# Patient Record
Sex: Female | Born: 1960 | Race: White | Hispanic: No | Marital: Married | Smoking: Former smoker
Health system: Southern US, Community
[De-identification: ages and names within clinical notes are randomized; demographics above are authoritative.]

## PROBLEM LIST (undated history)

## (undated) DIAGNOSIS — E7800 Pure hypercholesterolemia, unspecified: Secondary | ICD-10-CM

## (undated) DIAGNOSIS — I241 Dressler's syndrome: Secondary | ICD-10-CM

## (undated) DIAGNOSIS — R011 Cardiac murmur, unspecified: Secondary | ICD-10-CM

## (undated) DIAGNOSIS — I2510 Atherosclerotic heart disease of native coronary artery without angina pectoris: Secondary | ICD-10-CM

## (undated) DIAGNOSIS — Z720 Tobacco use: Secondary | ICD-10-CM

## (undated) DIAGNOSIS — I10 Essential (primary) hypertension: Secondary | ICD-10-CM

## (undated) DIAGNOSIS — J90 Pleural effusion, not elsewhere classified: Secondary | ICD-10-CM

## (undated) DIAGNOSIS — I513 Intracardiac thrombosis, not elsewhere classified: Secondary | ICD-10-CM

## (undated) DIAGNOSIS — R0602 Shortness of breath: Secondary | ICD-10-CM

## (undated) DIAGNOSIS — K429 Umbilical hernia without obstruction or gangrene: Secondary | ICD-10-CM

## (undated) DIAGNOSIS — I219 Acute myocardial infarction, unspecified: Secondary | ICD-10-CM

## (undated) DIAGNOSIS — I319 Disease of pericardium, unspecified: Secondary | ICD-10-CM

## (undated) DIAGNOSIS — F419 Anxiety disorder, unspecified: Secondary | ICD-10-CM

## (undated) DIAGNOSIS — G4730 Sleep apnea, unspecified: Secondary | ICD-10-CM

## (undated) DIAGNOSIS — F32A Depression, unspecified: Secondary | ICD-10-CM

## (undated) DIAGNOSIS — M797 Fibromyalgia: Secondary | ICD-10-CM

## (undated) DIAGNOSIS — M4800 Spinal stenosis, site unspecified: Secondary | ICD-10-CM

## (undated) DIAGNOSIS — K219 Gastro-esophageal reflux disease without esophagitis: Secondary | ICD-10-CM

## (undated) DIAGNOSIS — I209 Angina pectoris, unspecified: Secondary | ICD-10-CM

## (undated) DIAGNOSIS — I509 Heart failure, unspecified: Secondary | ICD-10-CM

## (undated) DIAGNOSIS — I739 Peripheral vascular disease, unspecified: Secondary | ICD-10-CM

## (undated) DIAGNOSIS — F329 Major depressive disorder, single episode, unspecified: Secondary | ICD-10-CM

## (undated) DIAGNOSIS — G2581 Restless legs syndrome: Secondary | ICD-10-CM

## (undated) DIAGNOSIS — R51 Headache: Secondary | ICD-10-CM

## (undated) HISTORY — PX: CORONARY ANGIOPLASTY WITH STENT PLACEMENT: SHX49

## (undated) HISTORY — PX: CARPAL TUNNEL RELEASE: SHX101

## (undated) HISTORY — PX: CORONARY ANGIOPLASTY: SHX604

---

## 1992-03-03 HISTORY — PX: TUBAL LIGATION: SHX77

## 2011-03-05 ENCOUNTER — Inpatient Hospital Stay (HOSPITAL_COMMUNITY): Payer: Self-pay

## 2011-03-05 ENCOUNTER — Inpatient Hospital Stay (HOSPITAL_COMMUNITY)
Admission: EM | Admit: 2011-03-05 | Discharge: 2011-03-15 | DRG: 247 | Disposition: A | Discharge status: Home / Self Care | Payer: MEDICAID | Source: Other Acute Inpatient Hospital | Attending: Cardiology | Admitting: Cardiology

## 2011-03-05 ENCOUNTER — Other Ambulatory Visit: Payer: Self-pay

## 2011-03-05 ENCOUNTER — Encounter (HOSPITAL_COMMUNITY): Payer: Self-pay | Admitting: Physician Assistant

## 2011-03-05 ENCOUNTER — Encounter (HOSPITAL_COMMUNITY)
Admission: EM | Disposition: A | Discharge status: Home / Self Care | Payer: Self-pay | Source: Other Acute Inpatient Hospital | Attending: Cardiology

## 2011-03-05 DIAGNOSIS — I219 Acute myocardial infarction, unspecified: Secondary | ICD-10-CM

## 2011-03-05 DIAGNOSIS — F17200 Nicotine dependence, unspecified, uncomplicated: Secondary | ICD-10-CM | POA: Diagnosis present

## 2011-03-05 DIAGNOSIS — I2119 ST elevation (STEMI) myocardial infarction involving other coronary artery of inferior wall: Principal | ICD-10-CM | POA: Diagnosis present

## 2011-03-05 DIAGNOSIS — R079 Chest pain, unspecified: Secondary | ICD-10-CM | POA: Diagnosis present

## 2011-03-05 DIAGNOSIS — I2510 Atherosclerotic heart disease of native coronary artery without angina pectoris: Secondary | ICD-10-CM

## 2011-03-05 DIAGNOSIS — I2109 ST elevation (STEMI) myocardial infarction involving other coronary artery of anterior wall: Secondary | ICD-10-CM | POA: Diagnosis present

## 2011-03-05 DIAGNOSIS — D649 Anemia, unspecified: Secondary | ICD-10-CM | POA: Diagnosis not present

## 2011-03-05 DIAGNOSIS — IMO0001 Reserved for inherently not codable concepts without codable children: Secondary | ICD-10-CM | POA: Diagnosis present

## 2011-03-05 DIAGNOSIS — E7800 Pure hypercholesterolemia, unspecified: Secondary | ICD-10-CM | POA: Diagnosis present

## 2011-03-05 DIAGNOSIS — F411 Generalized anxiety disorder: Secondary | ICD-10-CM | POA: Diagnosis present

## 2011-03-05 DIAGNOSIS — M4800 Spinal stenosis, site unspecified: Secondary | ICD-10-CM | POA: Diagnosis not present

## 2011-03-05 DIAGNOSIS — G2581 Restless legs syndrome: Secondary | ICD-10-CM | POA: Diagnosis present

## 2011-03-05 DIAGNOSIS — I241 Dressler's syndrome: Secondary | ICD-10-CM | POA: Diagnosis not present

## 2011-03-05 DIAGNOSIS — I959 Hypotension, unspecified: Secondary | ICD-10-CM | POA: Diagnosis not present

## 2011-03-05 DIAGNOSIS — M797 Fibromyalgia: Secondary | ICD-10-CM | POA: Diagnosis present

## 2011-03-05 DIAGNOSIS — I309 Acute pericarditis, unspecified: Secondary | ICD-10-CM

## 2011-03-05 DIAGNOSIS — Z720 Tobacco use: Secondary | ICD-10-CM

## 2011-03-05 HISTORY — PX: PERCUTANEOUS CORONARY STENT INTERVENTION (PCI-S): SHX5485

## 2011-03-05 HISTORY — DX: Anxiety disorder, unspecified: F41.9

## 2011-03-05 HISTORY — DX: Tobacco use: Z72.0

## 2011-03-05 HISTORY — PX: LEFT HEART CATHETERIZATION WITH CORONARY ANGIOGRAM: SHX5451

## 2011-03-05 HISTORY — DX: Acute myocardial infarction, unspecified: I21.9

## 2011-03-05 HISTORY — DX: Spinal stenosis, site unspecified: M48.00

## 2011-03-05 HISTORY — DX: Disease of pericardium, unspecified: I31.9

## 2011-03-05 HISTORY — DX: Restless legs syndrome: G25.81

## 2011-03-05 HISTORY — DX: Fibromyalgia: M79.7

## 2011-03-05 LAB — CBC
HCT: 42.9 % (ref 36.0–46.0)
Hemoglobin: 14.5 g/dL (ref 12.0–15.0)
MCH: 28.5 pg (ref 26.0–34.0)
MCHC: 33.8 g/dL (ref 30.0–36.0)
MCV: 84.4 fL (ref 78.0–100.0)
RBC: 5.08 MIL/uL (ref 3.87–5.11)

## 2011-03-05 LAB — COMPREHENSIVE METABOLIC PANEL
ALT: 23 U/L (ref 0–35)
BUN: 9 mg/dL (ref 6–23)
CO2: 21 mEq/L (ref 19–32)
Calcium: 8.7 mg/dL (ref 8.4–10.5)
Creatinine, Ser: 0.6 mg/dL (ref 0.50–1.10)
GFR calc Af Amer: >90 mL/min (ref >90)
GFR calc non Af Amer: >90 mL/min (ref >90)
Glucose, Bld: 119 mg/dL — ABNORMAL HIGH (ref 70–99)
Sodium: 135 mEq/L (ref 135–145)
Total Protein: 6.6 g/dL (ref 6.0–8.3)

## 2011-03-05 LAB — LIPID PANEL
Cholesterol: 237 mg/dL — ABNORMAL HIGH (ref 0–200)
VLDL: 26 mg/dL (ref 0–40)

## 2011-03-05 LAB — MRSA PCR SCREENING: MRSA by PCR: NEGATIVE

## 2011-03-05 LAB — CARDIAC PANEL(CRET KIN+CKTOT+MB+TROPI)
Total CK: 692 U/L — ABNORMAL HIGH (ref 7–177)
Total CK: 848 U/L — ABNORMAL HIGH (ref 7–177)
Troponin I: 8.89 ng/mL (ref <0.30)

## 2011-03-05 LAB — POCT I-STAT, CHEM 8
Calcium, Ion: 1.13 mmol/L (ref 1.12–1.32)
Chloride: 108 mEq/L (ref 96–112)
HCT: 46 % (ref 36.0–46.0)

## 2011-03-05 LAB — POCT ACTIVATED CLOTTING TIME: Activated Clotting Time: 435 seconds

## 2011-03-05 LAB — PROTIME-INR: INR: 7.11 (ref 0.00–1.49)

## 2011-03-05 LAB — HEMOGLOBIN A1C: Hgb A1c MFr Bld: 5.3 % (ref <5.7)

## 2011-03-05 LAB — APTT: aPTT: >200 seconds (ref 24–37)

## 2011-03-05 SURGERY — LEFT HEART CATHETERIZATION WITH CORONARY ANGIOGRAM
Anesthesia: LOCAL

## 2011-03-05 MED ORDER — HEPARIN (PORCINE) IN NACL 2-0.9 UNIT/ML-% IJ SOLN
INTRAMUSCULAR | Status: AC
  Filled 2011-03-05: qty 2000

## 2011-03-05 MED ORDER — MIDAZOLAM HCL 2 MG/2ML IJ SOLN
INTRAMUSCULAR | Status: AC
  Filled 2011-03-05: qty 2

## 2011-03-05 MED ORDER — NITROGLYCERIN 0.2 MG/ML ON CALL CATH LAB
INTRAVENOUS | Status: AC
  Filled 2011-03-05: qty 1

## 2011-03-05 MED ORDER — LIDOCAINE HCL (PF) 1 % IJ SOLN
INTRAMUSCULAR | Status: AC
  Filled 2011-03-05: qty 30

## 2011-03-05 MED ORDER — VITAMIN D (ERGOCALCIFEROL) 1.25 MG (50000 UNIT) PO CAPS
50000.0000 [IU] | ORAL_CAPSULE | ORAL | Status: DC
  Administered 2011-03-05: 50000 [IU] via ORAL
  Filled 2011-03-05: qty 1

## 2011-03-05 MED ORDER — HYDROCODONE-ACETAMINOPHEN 10-325 MG PO TABS
1.0000 | ORAL_TABLET | Freq: Four times a day (QID) | ORAL | Status: AC | PRN
  Administered 2011-03-05 – 2011-03-09 (×6): 1 via ORAL
  Filled 2011-03-05 (×7): qty 1

## 2011-03-05 MED ORDER — ZOLPIDEM TARTRATE 5 MG PO TABS
10.0000 mg | ORAL_TABLET | Freq: Every evening | ORAL | Status: DC | PRN
  Administered 2011-03-05 – 2011-03-14 (×8): 10 mg via ORAL
  Filled 2011-03-05 (×2): qty 1
  Filled 2011-03-05 (×8): qty 2

## 2011-03-05 MED ORDER — METOPROLOL TARTRATE 25 MG PO TABS
25.0000 mg | ORAL_TABLET | Freq: Two times a day (BID) | ORAL | Status: DC
  Administered 2011-03-05 – 2011-03-06 (×2): 25 mg via ORAL
  Filled 2011-03-05 (×3): qty 1

## 2011-03-05 MED ORDER — ACETAMINOPHEN 325 MG PO TABS: 650.0000 mg | ORAL_TABLET | ORAL | Status: DC | PRN

## 2011-03-05 MED ORDER — TIZANIDINE HCL 4 MG PO TABS
6.0000 mg | ORAL_TABLET | Freq: Three times a day (TID) | ORAL | Status: DC
  Administered 2011-03-05 – 2011-03-08 (×8): 6 mg via ORAL
  Filled 2011-03-05 (×10): qty 1

## 2011-03-05 MED ORDER — ADULT MULTIVITAMIN W/MINERALS CH
1.0000 | ORAL_TABLET | Freq: Every day | ORAL | Status: DC
  Administered 2011-03-05 – 2011-03-15 (×11): 1 via ORAL
  Filled 2011-03-05 (×11): qty 1

## 2011-03-05 MED ORDER — SODIUM CHLORIDE 0.9 % IV SOLN: INTRAVENOUS | Status: AC

## 2011-03-05 MED ORDER — POTASSIUM CHLORIDE CRYS ER 20 MEQ PO TBCR
30.0000 meq | EXTENDED_RELEASE_TABLET | Freq: Once | ORAL | Status: AC
  Administered 2011-03-05: 30 meq via ORAL
  Filled 2011-03-05 (×2): qty 1

## 2011-03-05 MED ORDER — BIVALIRUDIN 250 MG IV SOLR
INTRAVENOUS | Status: AC
  Filled 2011-03-05: qty 250

## 2011-03-05 MED ORDER — LISINOPRIL 5 MG PO TABS
5.0000 mg | ORAL_TABLET | Freq: Every day | ORAL | Status: DC
  Administered 2011-03-06: 5 mg via ORAL
  Filled 2011-03-05: qty 1

## 2011-03-05 MED ORDER — ONDANSETRON HCL 4 MG/2ML IJ SOLN: 4.0000 mg | Freq: Four times a day (QID) | INTRAMUSCULAR | Status: DC | PRN

## 2011-03-05 MED ORDER — METOPROLOL TARTRATE 1 MG/ML IV SOLN
INTRAVENOUS | Status: AC
  Filled 2011-03-05: qty 5

## 2011-03-05 MED ORDER — ROSUVASTATIN CALCIUM 40 MG PO TABS
40.0000 mg | ORAL_TABLET | Freq: Every day | ORAL | Status: DC
  Administered 2011-03-06 – 2011-03-14 (×9): 40 mg via ORAL
  Filled 2011-03-05 (×10): qty 1

## 2011-03-05 MED ORDER — GABAPENTIN 300 MG PO CAPS
600.0000 mg | ORAL_CAPSULE | Freq: Three times a day (TID) | ORAL | Status: DC
  Administered 2011-03-05 – 2011-03-15 (×29): 600 mg via ORAL
  Filled 2011-03-05 (×31): qty 2

## 2011-03-05 MED ORDER — ONE-DAILY MULTI VITAMINS PO TABS: 1.0000 | ORAL_TABLET | Freq: Every day | ORAL | Status: DC

## 2011-03-05 MED ORDER — FENTANYL CITRATE 0.05 MG/ML IJ SOLN
INTRAMUSCULAR | Status: AC
  Filled 2011-03-05: qty 2

## 2011-03-05 MED ORDER — ROPINIROLE HCL 1 MG PO TABS
2.0000 mg | ORAL_TABLET | Freq: Every day | ORAL | Status: DC
  Administered 2011-03-05 – 2011-03-14 (×10): 2 mg via ORAL
  Filled 2011-03-05 (×11): qty 2

## 2011-03-05 MED ORDER — ASPIRIN 81 MG PO CHEW
81.0000 mg | CHEWABLE_TABLET | Freq: Every day | ORAL | Status: DC
  Administered 2011-03-05 – 2011-03-06 (×2): 81 mg via ORAL
  Filled 2011-03-05 (×2): qty 1

## 2011-03-05 MED ORDER — CLOPIDOGREL BISULFATE 75 MG PO TABS
75.0000 mg | ORAL_TABLET | Freq: Every day | ORAL | Status: DC
  Administered 2011-03-06 – 2011-03-15 (×10): 75 mg via ORAL
  Filled 2011-03-05 (×13): qty 1

## 2011-03-05 MED ORDER — VERAPAMIL HCL 2.5 MG/ML IV SOLN
INTRAVENOUS | Status: AC
  Filled 2011-03-05: qty 2

## 2011-03-05 NOTE — Progress Notes (Signed)
Chaplain Note:  Chaplain responded to Code STEMI page.  Chaplain provided spiritual support for pt and staff.  Pt was being treated by Cath Lab staff.  Chaplain did not communicated directly.  None of pt's friends or family were present during this time.   03/05/11 1520  Clinical Encounter Type  Visited With Health care provider;Patient  Visit Type Spiritual support  Referral From Other (Comment) (Code STEMI page)  Stress Factors  Patient Stress Factors Health changes    Verdie Shire, chaplain resident [phone]

## 2011-03-05 NOTE — Interval H&P Note (Signed)
History and Physical Interval Note:  03/05/2011 5:19 PM  Kristy Sloan  has presented today for surgery, with the diagnosis of stemi  The various methods of treatment have been discussed with the patient and family. After consideration of risks, benefits and other options for treatment, the patient has consented to  Procedure(s): LEFT HEART CATHETERIZATION WITH CORONARY ANGIOGRAM PERCUTANEOUS CORONARY STENT INTERVENTION (PCI-S) as a surgical intervention .  The patients' history has been reviewed, patient examined, and ECG reviewed.  Emergency cath and poss PCI with reperfusion therapy recommended.  Patient is agreeable.   Shawnie Pons

## 2011-03-05 NOTE — Progress Notes (Signed)
Left femoral cath sheath pulled at 1830 and pressure held until 0700. Site at level 0. VS remain stable. Pt. Tolerated procedure well.  Applied gauze dressing to site.

## 2011-03-05 NOTE — Op Note (Signed)
Cardiac Catheterization Procedure Note  Name: Kriss Perleberg MRN: [id number] DOB: [birth year]  Procedure: Left Heart Cath, Selective Coronary Angiography, LV angiography,  PTCA/Stent of LAD  Indication: Ms. Bochicchio is a 51 year old who's had chest pain since yesterday. She thought she was having an anxiety attack. She presented today to [facility] emergency room, and the EKG was consistent with a myocardial infarction. Findings include inferior and anterolateral leads. She was transferred emergently to [facility] for cardiac catheterization possible PCI.  Diagnostic Procedure Details: Following emergency consent, the left groin was prepped, draped, and anesthetized with 1% lidocaine. Using a smart needle, a 6 French sheath was introduced into the left femoral artery. Standard Judkins catheters were used for selective coronary angiography and left ventriculography. Catheter exchanges were performed over a wire.  The diagnostic procedure was well-tolerated without immediate complications.  Findings demonstrated a ruptured plaque in the proximal left anterior descending artery, and distal embolization at the apex. The right coronary artery and circumflex coronary artery were patent. Preparations were then made for emergency PCI. Bivalirudin was given using a weight-based protocol. The patient received 600 mg of Plavix at the outside facility. She also received chewable aspirin and unfractionated heparin. ACT was checked and was appropriate. A JL 3.5 guiding catheter was utilized. We placed a pro-water wire in the intermediate branch as an anchor, and placed a traverse wire down the left anterior descending artery. The proximal left anterior descending artery was then dilated using a 2.25 mm balloon. We then took the balloon down to the apex to see if this would help with reperfusion at the site of the apical occlusion. The vessel did not open with this, and a 1.5 x 20 mm balloon was placed down at the apical  vessel and several dilatations performed. The vessel in this location appeared to be too small for safe aspiration. Our impression was that the infarct was probably a late apical infarct, as a result of embolization from a high-grade proximal LAD ruptured plaque.  We proceeded to stent the proximal left anterior descending artery with a 2.75 x 16 mm Promus element drug-eluting stent. There was an excellent angiographic result. There did not appear to be significant side branch compromise. Postdilatation was done with a 3 mm noncompliant balloon. There was also evidence of ostial left anterior descending disease, best seen in the LAO caudal views, but it did not appear to be critical and we felt that conservative approach to this was the best option.  Her chest pain was improved. We also, during the case, injected verapamil 200 mcg down the coronary artery in the instance that the distal occlusion represented spasm. This also led to no improvement in the apical vessel. The procedure was subsequently completed, and the femoral sheath secured in place. She was taken to the coronary care unit for convalescent care, and I spoke with her husband by phone.  There were no major complications of therapy.  PROCEDURAL FINDINGS Hemodynamics: AO 141/72 (100) LV 139/20 No gradient on pull back.    Coronary angiography: Coronary dominance: right  Left mainstem: The LMCA was free of significant disease.  Left anterior descending (LAD):  The left anterior descending artery coursed to the cardiac apex. The ostium had about 40% eccentric narrowing. Between the ostium and the major diagonal branch, there was evidence of a ruptured plaque, with hazy appearance and 80% eccentric stenosis.  This was stented to 0% across the smaller diagonal branch without compromise. The distal LAD was totally occluded due to what  appeared to be most likely distal embolization, not from the procedure, but spontaneous  (probably yesterday and  the likely source of continuing pain and ECG change).  Despite dilatation with small balloons, no reperfusion was established in the apical portion of the vessel.  We thought the vessel was likely to small for aspiration, and could also represent apical edema preventing flow.  The proximal site, source of likely embolization, appeared excellent post PCI.   Left circumflex (LCx): The ramus and AV circ supplying the OM was of moderate size, and without critical narrowing.    Right coronary artery (RCA): The proximal RCA had mild irregularity.  The distal vessel supplied a smaller caliber PDA and PLA, without significant narrowing.  Left ventriculography: Left ventricular systolic function is reduced, LVEF is estimated at 45%, there is no significant mitral regurgitation.  There is an apical WMA.     PCI Data: Vessel - LAD/Segment - 12 Percent Stenosis (pre)  80% ruptured eccentric plaque TIMI-flow 3 Stent 20 by 2.75 Promus Element Percent Stenosis (post) 0 TIMI-flow (post) 3  Final Conclusions:   1.  Acute anterior MI with ruptured proximal LAD lesion, with distal embolization and occlusion of the apical LAD. 2.  Successful PCI of the proximal ruptured plaque with DES   (Promus Element) 3.  Persistent occlusion of the apical LAD despite balloon and "dottering"  (small caliber vessel) 4.  No critical disease in CFX or RCA 5. 40-50% ostial LAD disease, but widely patent  Recommendations:  1. DAPT for minimum one year 2. Beta blockers, ACE, and statin 3.  Tobacco cessation treatment 4. 2D echo to exclude apical mural thrombus. 5.  Immediate monitoring in CCU.  Shawnie Pons 03/05/2011, 5:34 PM

## 2011-03-05 NOTE — Progress Notes (Signed)
PM  Stable.  No chest pain. Sheath out and stable. Enzymes pos, and ECG shows persistent STE consistent with her late presentation, and apical vessel with no flow, likely due to myocardial edema.  I explained this to the patient and family.  Stent site looks excellent.  Risks of myocardial rupture and LV mural thrombus are higher, but she will be monitored in the hospital.    Shawnie Pons 8:46 PM 03/05/2011

## 2011-03-05 NOTE — H&P (Signed)
History and Physical  Patient ID: Kristy Sloan MRN: [id number], SOB: 12/08/1960 51 y.o. Date of Encounter: 03/05/2011, 3:35 PM  Primary Physician: No primary provider on file. Primary Cardiologist: New  Chief Complaint: chest pain Reason for Admission: STEMI  HPI: 51 y/o F with no prior cardiac hx presented to [facility] this afternoon with complaints of progressive left-sided chest pain since 6 pm last night which has gotten worse over the last several hours. It has been associated with shortness of breath and nausea. No diaphoresis or similar symptoms before. EKG at [facility] demonstrated ST elevation inferiorly & laterally. She was given 324mg  of ASA, 600mg  of Plavix, and started on heparin then transferred to [facility] for emergent catheterization. At this time she is having ongoing pain. Of note she is also on her period which is the first she's had in 9 months.  Past Medical History  Diagnosis Date  . Anxiety   . Spinal stenosis   . Fibromyalgia   . Restless legs syndrome      Surgical History:  Past Surgical History  Procedure Date  . Hand surgery   . Cesarean section      Home Meds: Medication Sig  ferrous fumarate-iron polysaccharide complex (TANDEM) 162-115.2 MG CAPS Take 1 capsule by mouth daily.    gabapentin (NEURONTIN) 300 MG capsule Take 600 mg by mouth 3 (three) times daily.    HYDROcodone-acetaminophen (LORTAB) 10-500 MG per tablet Take 1 tablet by mouth QID.    Multiple Vitamin (MULTIVITAMIN) tablet Take 1 tablet by mouth daily.    rOPINIRole (REQUIP) 0.5 MG tablet Take 2 mg by mouth at bedtime.    tiZANidine (ZANAFLEX) 4 MG tablet Take 6 mg by mouth 3 (three) times daily.    Vitamin D, Ergocalciferol, (DRISDOL) 50000 UNITS CAPS Take 50,000 Units by mouth every 7 (seven) days.    zolpidem (AMBIEN) 10 MG tablet Take 10 mg by mouth at bedtime as needed.      Allergies: None per pt  Social History   Social History Main Topics  . Smoking  status: Former Smoker    Types: Cigarettes  . Smokeless tobacco: Not on file   Comment: Quit 3 months ago  . Alcohol Use: No  . Drug Use: No  . Sexually Active: Not on file   Other Topics Concern  . Not on file   Social History Narrative   [place] Mar[place]. Doesn't currently work.     Family History  Problem Relation Age of Onset  . Coronary artery disease Father   . Coronary artery disease Sister     Posey Rea of age, but sister is younger    Review of Systems: General: negative for chills, fever, night sweats or weight changes.  Cardiovascular: see above. No edema, orthopnea, palpitations, paroxysmal nocturnal dyspnea,  Dermatological: negative for rash Respiratory: negative for cough or wheezing Urologic: negative for hematuria Abdominal: +nausea. No diarrhea, bright red blood per rectum, melena, or hematemesis Neurologic: negative for visual changes, syncope, or dizziness +on her period, last one 9 months ago All other systems reviewed and are otherwise negative except as noted above.  Labs: None yet  Radiology/Studies: No results found.   EKG: Sinus tach 107 bpm ST elevation II, III, avF and V3-V6  Physical Exam: P93 BP130/82 RR16 POX 99%2L  General: Well developed, well nourished, in no acute distress. Head: Normocephalic, atraumatic, sclera non-icteric, no xanthomas, nares are without discharge.  Neck: No lymphadenopathy. JVD not elevated.  Lungs: Clear bilaterally to auscultation without wheezes, rales, or rhonchi. Breathing is unlabored. Heart: Slightly tachycardic, regular with S1 S2. No murmurs, rubs, or gallops appreciated. Abdomen: Soft, non-tender, non-distended with normoactive bowel sounds. No hepatomegaly. No rebound/guarding. No obvious abdominal masses. Msk:  Strength and tone appear normal for age. Extremities: No clubbing or cyanosis. No edema.  Distal pedal pulses are 2+ and equal bilaterally. Neuro: Alert and oriented X 3.  Moves all extremities spontaneously. Psych:  Anxious appearing. Responds to questions appropriately with a normal affect.     ASSESSMENT AND PLAN:  1. Acute inferolateral STEMI - for emergent catheterization +/- PCI depending on results. Meds including antiplatelet agent per Dr. Riley Kill pending completion of case.  2. Anxiety - would likely continue home meds if suitable post-cath.  Signed, Ronie Spies PA-C 03/05/2011, 3:35 PM  Patient seen.  Cardiac exam without sig murmur at present. .  ECG consistent with STEMI, probably of 12 hours duration.  Urgent cath with possible PCI recommended as an emergency procedure.  She presented late in part thinking this was an anxiety attack, but has had persistent pain.  I briefly discussed risks with the patient, and she was agreeable to proceeding with emergency cardiac catheterization.    Shawnie Pons 03/05/2011 5:19 PM  5:18 PM

## 2011-03-06 ENCOUNTER — Other Ambulatory Visit: Payer: Self-pay

## 2011-03-06 ENCOUNTER — Encounter (HOSPITAL_COMMUNITY)
Admission: EM | Disposition: A | Discharge status: Home / Self Care | Payer: Self-pay | Source: Other Acute Inpatient Hospital | Attending: Cardiology

## 2011-03-06 ENCOUNTER — Encounter (HOSPITAL_COMMUNITY): Payer: Self-pay | Admitting: Dietician

## 2011-03-06 DIAGNOSIS — I059 Rheumatic mitral valve disease, unspecified: Secondary | ICD-10-CM

## 2011-03-06 DIAGNOSIS — R079 Chest pain, unspecified: Secondary | ICD-10-CM | POA: Diagnosis present

## 2011-03-06 DIAGNOSIS — I2510 Atherosclerotic heart disease of native coronary artery without angina pectoris: Secondary | ICD-10-CM

## 2011-03-06 DIAGNOSIS — I2109 ST elevation (STEMI) myocardial infarction involving other coronary artery of anterior wall: Secondary | ICD-10-CM | POA: Diagnosis present

## 2011-03-06 DIAGNOSIS — M797 Fibromyalgia: Secondary | ICD-10-CM | POA: Diagnosis present

## 2011-03-06 HISTORY — PX: LEFT HEART CATHETERIZATION WITH CORONARY ANGIOGRAM: SHX5451

## 2011-03-06 LAB — CBC
HCT: 43 % (ref 36.0–46.0)
MCHC: 32.6 g/dL (ref 30.0–36.0)
RDW: 15.3 % (ref 11.5–15.5)

## 2011-03-06 LAB — BASIC METABOLIC PANEL
BUN: 8 mg/dL (ref 6–23)
GFR calc Af Amer: >90 mL/min (ref >90)
GFR calc non Af Amer: >90 mL/min (ref >90)
Potassium: 3.9 mEq/L (ref 3.5–5.1)
Sodium: 135 mEq/L (ref 135–145)

## 2011-03-06 LAB — CARDIAC PANEL(CRET KIN+CKTOT+MB+TROPI)
CK, MB: 34.6 ng/mL (ref 0.3–4.0)
Relative Index: 16.5 — ABNORMAL HIGH (ref 0.0–2.5)
Relative Index: 16.1 — ABNORMAL HIGH (ref 0.0–2.5)
Total CK: 654 U/L — ABNORMAL HIGH (ref 7–177)
Troponin I: 13.36 ng/mL (ref <0.30)
Troponin I: 12.19 ng/mL (ref <0.30)
Troponin I: 7.86 ng/mL (ref <0.30)

## 2011-03-06 SURGERY — LEFT HEART CATHETERIZATION WITH CORONARY ANGIOGRAM
Anesthesia: LOCAL

## 2011-03-06 MED ORDER — MORPHINE SULFATE 2 MG/ML IJ SOLN
2.0000 mg | INTRAMUSCULAR | Status: DC | PRN
  Administered 2011-03-06 – 2011-03-07 (×4): 2 mg via INTRAVENOUS
  Filled 2011-03-06 (×3): qty 1

## 2011-03-06 MED ORDER — SODIUM CHLORIDE 0.9 % IJ SOLN: 3.0000 mL | Freq: Two times a day (BID) | INTRAMUSCULAR | Status: DC

## 2011-03-06 MED ORDER — MORPHINE SULFATE 4 MG/ML IJ SOLN
INTRAMUSCULAR | Status: AC
  Filled 2011-03-06: qty 1

## 2011-03-06 MED ORDER — INFLUENZA VIRUS VACC SPLIT PF IM SUSP: 0.5000 mL | INTRAMUSCULAR | Status: DC

## 2011-03-06 MED ORDER — METOPROLOL TARTRATE 25 MG PO TABS
25.0000 mg | ORAL_TABLET | Freq: Three times a day (TID) | ORAL | Status: DC
  Administered 2011-03-08 – 2011-03-09 (×5): 25 mg via ORAL
  Filled 2011-03-06 (×13): qty 1

## 2011-03-06 MED ORDER — SODIUM CHLORIDE 0.9 % IV SOLN
INTRAVENOUS | Status: DC
  Administered 2011-03-06: 100 mL/h via INTRAVENOUS

## 2011-03-06 MED ORDER — ONDANSETRON HCL 4 MG/2ML IJ SOLN
INTRAMUSCULAR | Status: AC
  Filled 2011-03-06: qty 2

## 2011-03-06 MED ORDER — NITROGLYCERIN 0.2 MG/ML ON CALL CATH LAB
INTRAVENOUS | Status: AC
  Filled 2011-03-06: qty 1

## 2011-03-06 MED ORDER — FENTANYL CITRATE 0.05 MG/ML IJ SOLN
INTRAMUSCULAR | Status: AC
  Filled 2011-03-06: qty 2

## 2011-03-06 MED ORDER — MORPHINE SULFATE 2 MG/ML IJ SOLN
INTRAMUSCULAR | Status: AC
  Administered 2011-03-06: 2 mg via INTRAVENOUS
  Filled 2011-03-06: qty 1

## 2011-03-06 MED ORDER — SODIUM CHLORIDE 0.9 % IJ SOLN: 3.0000 mL | INTRAMUSCULAR | Status: DC | PRN

## 2011-03-06 MED ORDER — SODIUM CHLORIDE 0.9 % IV SOLN: 1.0000 mL/kg/h | INTRAVENOUS | Status: DC

## 2011-03-06 MED ORDER — MORPHINE SULFATE 4 MG/ML IJ SOLN
2.0000 mg | Freq: Once | INTRAMUSCULAR | Status: AC
  Administered 2011-03-06: 2 mg via INTRAVENOUS

## 2011-03-06 MED ORDER — DIAZEPAM 5 MG PO TABS
ORAL_TABLET | ORAL | Status: AC
  Filled 2011-03-06: qty 1

## 2011-03-06 MED ORDER — LIDOCAINE HCL (PF) 1 % IJ SOLN
INTRAMUSCULAR | Status: AC
  Filled 2011-03-06: qty 30

## 2011-03-06 MED ORDER — SODIUM CHLORIDE 0.9 % IV BOLUS (SEPSIS): 500.0000 mL | Freq: Once | INTRAVENOUS | Status: DC

## 2011-03-06 MED ORDER — MORPHINE SULFATE 2 MG/ML IJ SOLN
INTRAMUSCULAR | Status: AC
  Administered 2011-03-06: 2 mg via INTRAVASCULAR
  Filled 2011-03-06: qty 1

## 2011-03-06 MED ORDER — MIDAZOLAM HCL 2 MG/2ML IJ SOLN
INTRAMUSCULAR | Status: AC
  Filled 2011-03-06: qty 2

## 2011-03-06 MED ORDER — SODIUM CHLORIDE 0.9 % IV BOLUS (SEPSIS)
250.0000 mL | Freq: Once | INTRAVENOUS | Status: AC
  Administered 2011-03-06: 250 mL via INTRAVENOUS

## 2011-03-06 MED ORDER — ASPIRIN 325 MG PO TABS
650.0000 mg | ORAL_TABLET | Freq: Once | ORAL | Status: AC
  Administered 2011-03-06: 650 mg via ORAL
  Filled 2011-03-06: qty 2

## 2011-03-06 MED ORDER — MILNACIPRAN HCL 50 MG PO TABS
100.0000 mg | ORAL_TABLET | Freq: Two times a day (BID) | ORAL | Status: DC
  Administered 2011-03-06 – 2011-03-12 (×14): 100 mg via ORAL
  Filled 2011-03-06 (×18): qty 2

## 2011-03-06 MED ORDER — SODIUM CHLORIDE 0.9 % IV SOLN: 250.0000 mL | INTRAVENOUS | Status: DC | PRN

## 2011-03-06 MED ORDER — SODIUM CHLORIDE 0.9 % IV SOLN: INTRAVENOUS | Status: AC

## 2011-03-06 MED ORDER — HEPARIN (PORCINE) IN NACL 2-0.9 UNIT/ML-% IJ SOLN
INTRAMUSCULAR | Status: AC
  Filled 2011-03-06: qty 2000

## 2011-03-06 MED ORDER — METOPROLOL TARTRATE 25 MG PO TABS: 25.0000 mg | ORAL_TABLET | Freq: Once | ORAL | Status: DC

## 2011-03-06 MED ORDER — DIAZEPAM 5 MG PO TABS
5.0000 mg | ORAL_TABLET | ORAL | Status: AC
  Administered 2011-03-06: 5 mg via ORAL

## 2011-03-06 MED FILL — Dextrose Inj 5%: INTRAVENOUS | Qty: 50 | Status: AC

## 2011-03-06 NOTE — Progress Notes (Signed)
Notified by RN regarding worsening ST elevations on EKG, previously noted from this morning at which time patient was seen by Dr. Riley Kill. She reports chest pain worse with inspiration and SOB. Pt in NAD. CV- tachycardic, clear S1, S2, no rubs, murmurs, no JVD. Lungs- CTAB no wheezes, rales or rhonchi. Ext- no LE edema, pulses 2+ throughout. EKG revealed NSR, borderline tachycardic at 99bpm increased ST elevations V2-V5, II, III, aVF. Dr. Riley Kill notified, saw patient, and elected for repeat cardiac catheterization to ensure vessel patency. Pre-cath orders placed. She was informed, consented and agreed to the procedure. She will be prepped for cath.   Jacqulyn Bath, PA-C 03/06/2011 3:47 PM  Patient seen and agree.  Will take back to the cath lab to look at PCI site.   Patient is agreeable to going back to the lab.    Shawnie Pons 9:06 PM 03/06/2011

## 2011-03-06 NOTE — Progress Notes (Signed)
Patient has pain only with inspiration.  Her EF is nearly normal by echo.  Her ECG is abnormal, but the proximal stent site is open, and fortunately, her P2Y12 assay reveals a clopidogrel responder.  Her HR is elevated.  Will give additional fluids, then increase metoprolol to every eight hours to slow her rate.  Ace has been placed on hold.    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  Will continue to observe.    Shawnie Pons 9:05 PM 03/06/2011

## 2011-03-06 NOTE — Progress Notes (Signed)
More ST elevation noted on monitor than previously. Notified Dr. Riley Kill. 12 Lead done and taken to him in cath lab. Order recv;d. To prep for cath.

## 2011-03-06 NOTE — Op Note (Signed)
Cardiac Catheterization Procedure Note  Name: Kristy Sloan MRN: [id number] DOB: Sep 19, 1960  Procedure: Placement of catheters for angio without left heart cath, coronary angiography.  Indication: [name] presented yesterday with delayed presentation anterior MI with distal occlusion likely secondary to distal embolization and late myocardial edema, and proximal ruptured plaque.  She had stenting of the proximal plaque rupture and an attempt to open the apical vessel without success.  She has had persistent STE on ECG, ?slightly worse, as well as continued chest pain, but mostly with inspiration.  Pericarditis has been suspected.  The echo does not suggest rupture.  EF is preserved, and the prox septum still moves.  However, repeat angio was thought to be worthwhile to document patency.     Procedural details: The right groin was prepped, draped, and anesthetized with 1% lidocaine. Using modified Seldinger technique, a 4 French sheath was introduced into the right femoral artery. Standard Judkins catheters were used for coronary angiography and left ventriculography. Catheter exchanges were performed over a guidewire. There were no immediate procedural complications. The patient was transferred to the post catheterization recovery area for further monitoring.  Procedural Findings: Hemodynamics:  AO 101/62 LV not measured   Coronary angiography: Coronary dominance: right  Left mainstem: Widely patent  Left anterior descending (LAD): The proximal LAD has an eccentric 60% area of disease that remains patent.  It abuts the ramus, which also has some proximal plaque, but is patent with TIMI 3 flow.  The stent site is widely patent and flow into the diagonal is normal.  The distal, apical LAD is totally occluded as on the previous study, with no flow, similar to yesterday.  This was dilated multiple times without hint of patency, suggesting late presentation edema at the infarct site as the cause  of continued vessel closure.    Left circumflex (LCx): The ramus is widely patent with TIMI 3 flow.  There is mild proximal narrowing.   The AV circumflex is large in caliber and with free of critical disease.    Right coronary artery (RCA): RCA was not reimaged, based on the ECG, and it was normal yesterday.   Left ventriculography: Not done  Final Conclusions:   1.  Continued patency of the LAD stent with continued occlusion of the apical LAD 2.  Proximal LAD disease at the ostium with about 50-60% narrowing, but excellent flow.   3.  No change in other vasculature  Recommendations:  This is a difficult situation.  Her pain is worse with inspiration.  We have reviewed her echo.  She has persistent STE without change.  Suspect she has apical myocardial edema from an occlusion arising from a distal embolization of a proximal plaque rupture.  Her symptoms suggest post MI pericarditis, and she will be monitored for this.  We will repeat her echo.  I did not elect to IVUS the proximal LAD as there is good flow, and stenting this in the current situation is likely not to help, and could complicate things if there is ramus compromise.  We will continue to monitor her closely.  Shawnie Pons 5:33 PM 03/06/2011   Shawnie Pons 03/06/2011, 5:09 PM

## 2011-03-06 NOTE — Progress Notes (Signed)
No change in chest pressure. Notified Trish w/[place]. She will send Dr. Riley Kill to see.

## 2011-03-06 NOTE — Progress Notes (Signed)
Develop hypotension, but now improved with IV fluids.  Echo study discussed with Dr. Jens Som.  No evidence of myocardial rupture  (early), and overall LV preserved with hyperdynamic function in non infarct zones.  Pain is worse with inspiration.  Prox sept is moving by echo.    Continue to observe.  May need to use NSAIDS for pericardial pain if not relieved, but will monitor for now. Will get her up in chair.    Kristy Sloan 1:13 PM 03/06/2011

## 2011-03-06 NOTE — Progress Notes (Signed)
Subjective:  She is still complaining of some chest pain.  It is worse with inspiration, and goes to the neck.   Objective:  Vital Signs in the last 24 hours: Temp:  [98 F (36.7 C)-98.6 F (37 C)] 98.6 F (37 C) (01/03 0800) Pulse Rate:  [80-100] 100  (01/03 1034) Resp:  [16-23] 19  (01/03 1034) BP: (92-143)/(46-79) 105/61 mmHg (01/03 0900) SpO2:  [97 %-100 %] 99 % (01/03 1034) Weight:  [108.3 kg (238 lb 12.1 oz)] 238 lb 12.1 oz (108.3 kg) (01/02 1800)  Intake/Output from previous day: 01/02 0701 - 01/03 0700 In: 650.8 [P.O.:480; I.V.:170.8] Out: 700 [Urine:700]   Physical Exam: General: Well developed, well nourished, in no acute distress. Head:  Normocephalic and atraumatic. Lungs: Clear to auscultation and percussion. Heart: Normal S1 and S2.  Minimal SEM.  No DM.  I do not appreciate a rub.  Pulses: Pulses normal in all 4 extremities. Extremities: No clubbing or cyanosis. No edema. Neurologic: Alert and oriented x 3. Groin stable     Lab Results:  Basename 03/06/11 0255 03/05/11 1535 03/05/11 1530  WBC 16.8* -- 17.7*  HGB 14.0 15.6* --  PLT 273 -- 281    Basename 03/06/11 0255 03/05/11 1535 03/05/11 1530  NA 135 139 --  K 3.9 3.7 --  CL 105 108 --  CO2 22 -- 21  GLUCOSE 129* 126* --  BUN 8 9 --  CREATININE 0.69 0.60 --    Basename 03/06/11 0915 03/06/11 0255  TROPONINI 13.36* 13.91*   Hepatic Function Panel  Basename 03/05/11 1530  PROT 6.6  ALBUMIN 3.1*  AST 44*  ALT 23  ALKPHOS 77  BILITOT 0.4  BILIDIR --  IBILI --    Basename 03/05/11 1530  CHOL 237*   No results found for this basename: PROTIME in the last 72 hours  Imaging: Dg Chest Port 1 View  03/05/2011  *RADIOLOGY REPORT*  Clinical Data: Post catheterization.  PORTABLE CHEST - 1 VIEW  Comparison: 03/05/2011.  Findings: 1903 hours.  There are persistent low lung volumes.  The lungs remain clear.  There is no pleural effusion or pneumothorax. Heart size and mediastinal contours are  normal.  Multiple telemetry leads overlie the chest. There is a possible right shoulder loose body.  IMPRESSION: Stable examination.  No active cardiopulmonary process.  Original Report Authenticated By: Gerrianne Scale, M.D.    EKG:  NSR.  Diffuse STE as no post MI tracing.  Lead position has changed.  Otherwise, no sig changed from yesterday.  Cardiac Studies:  Echo done, but pending.  Not yet unloaded.  Dr. Jens Som to review when available.   Assessment/Plan: S/P PCI for anterior MI--see note.  Prox ruptured plaque with distal embolization Chest pain---likely early pericardial pain given total distal occlusion, worse with inspiration, ST changes unchanged--continue to observe, 2D echo, repeat cath possibly if symptoms persist.  Tobacco Abuse--couneseling.   Keep in unit today, and monitor closely.   Patient Active Hospital Problem List: No active hospital problems.      Shawnie Pons, MD, [facility], FSCAI 03/06/2011, 10:43 AM

## 2011-03-06 NOTE — Progress Notes (Signed)
Dr. Riley Kill notified of con't. Elevated HR. Order to give 2200 Lopressor now and decrease IVF to 100cc/hr.

## 2011-03-06 NOTE — Progress Notes (Signed)
  Echocardiogram 2D Echocardiogram has been performed.  Chadrick Sprinkle Nira Retort 03/06/2011, 9:55 AM

## 2011-03-06 NOTE — Consult Note (Signed)
Pt states she quit 3 months ago and has remained tobacco free since. Congratulated and encouraged pt to remain tobacco free. Discussed relapse prevention strategies. Referred pt to 1-800-quit-now. Reviewed and gave pt education/contact info.

## 2011-03-07 ENCOUNTER — Other Ambulatory Visit: Payer: Self-pay

## 2011-03-07 ENCOUNTER — Inpatient Hospital Stay (HOSPITAL_COMMUNITY): Payer: Self-pay

## 2011-03-07 DIAGNOSIS — I219 Acute myocardial infarction, unspecified: Secondary | ICD-10-CM

## 2011-03-07 LAB — URINALYSIS, ROUTINE W REFLEX MICROSCOPIC
Ketones, ur: NEGATIVE mg/dL
Nitrite: POSITIVE — AB
Urobilinogen, UA: 1 mg/dL (ref 0.0–1.0)
pH: 5 (ref 5.0–8.0)

## 2011-03-07 LAB — CARDIAC PANEL(CRET KIN+CKTOT+MB+TROPI)
Relative Index: 6.1 — ABNORMAL HIGH (ref 0.0–2.5)
Troponin I: 6.41 ng/mL (ref <0.30)

## 2011-03-07 LAB — CULTURE, BLOOD (ROUTINE X 2): Culture  Setup Time: 201301041453

## 2011-03-07 LAB — BASIC METABOLIC PANEL
BUN: 16 mg/dL (ref 6–23)
CO2: 20 mEq/L (ref 19–32)
Chloride: 107 mEq/L (ref 96–112)
Creatinine, Ser: 0.87 mg/dL (ref 0.50–1.10)
Potassium: 4.5 mEq/L (ref 3.5–5.1)

## 2011-03-07 LAB — URINE MICROSCOPIC-ADD ON

## 2011-03-07 LAB — CBC
HCT: 31.6 % — ABNORMAL LOW (ref 36.0–46.0)
Hemoglobin: 10.2 g/dL — ABNORMAL LOW (ref 12.0–15.0)
MCV: 88 fL (ref 78.0–100.0)
RBC: 3.59 MIL/uL — ABNORMAL LOW (ref 3.87–5.11)
WBC: 13.5 10*3/uL — ABNORMAL HIGH (ref 4.0–10.5)

## 2011-03-07 MED ORDER — SODIUM CHLORIDE 0.9 % IV SOLN
INTRAVENOUS | Status: DC
  Administered 2011-03-07: 250 mL/h via INTRAVENOUS

## 2011-03-07 MED ORDER — SODIUM CHLORIDE 0.9 % IV SOLN
INTRAVENOUS | Status: AC
  Administered 2011-03-07: 20:00:00 via INTRAVENOUS

## 2011-03-07 MED ORDER — METOPROLOL TARTRATE 1 MG/ML IV SOLN
2.5000 mg | Freq: Once | INTRAVENOUS | Status: AC
  Administered 2011-03-07: 2.5 mg via INTRAVENOUS

## 2011-03-07 MED ORDER — ASPIRIN 81 MG PO CHEW
81.0000 mg | CHEWABLE_TABLET | Freq: Every day | ORAL | Status: DC
  Administered 2011-03-09 – 2011-03-15 (×7): 81 mg via ORAL
  Filled 2011-03-07 (×6): qty 1

## 2011-03-07 MED ORDER — ASPIRIN 325 MG PO TABS
650.0000 mg | ORAL_TABLET | Freq: Four times a day (QID) | ORAL | Status: AC
  Administered 2011-03-07 – 2011-03-08 (×4): 650 mg via ORAL
  Filled 2011-03-07 (×2): qty 2

## 2011-03-07 MED ORDER — PANTOPRAZOLE SODIUM 40 MG PO TBEC
40.0000 mg | DELAYED_RELEASE_TABLET | Freq: Every day | ORAL | Status: DC
  Administered 2011-03-08 – 2011-03-15 (×8): 40 mg via ORAL
  Filled 2011-03-07: qty 13
  Filled 2011-03-07 (×7): qty 1

## 2011-03-07 MED ORDER — METOPROLOL TARTRATE 1 MG/ML IV SOLN
INTRAVENOUS | Status: AC
  Administered 2011-03-07: 2.5 mg via INTRAVENOUS
  Filled 2011-03-07: qty 5

## 2011-03-07 MED ORDER — ASPIRIN 325 MG PO TABS
325.0000 mg | ORAL_TABLET | Freq: Four times a day (QID) | ORAL | Status: DC
  Filled 2011-03-07: qty 1

## 2011-03-07 NOTE — Progress Notes (Signed)
  Echocardiogram 2D Echocardiogram has been performed.  Brandan Glauber Lynn Temperence Zenor 03/07/2011, 7:46 AM 

## 2011-03-07 NOTE — Progress Notes (Signed)
Called to see patient due to severe substernal chest pain.  She was admitted with an late presenting anterior MI and underwent PCI of ruptured plaque in proximal LAD.  She was noted at that time to have a 40% ostial LAD and occluded distal LAD felt due to distal embolization of clot.  She underwent PCI of prox LAD with DES.  She had recurrent pleuritic CP yesterday with ? Worsening ST elevation and underwent repeat cath which showed patent stent and 40-60% ostial LAD stenosis with good flow.  It was felt that she had pericarditis although echo did not show any pericardial fluid.  Early this am around 4am she had worsening of her chest pain and EKG now shows 4mm J point elevation in V2-V4 which was only 2mm on EKG yesterday. The patient has severe pain that is constant and not just with breathing.  She is tachycardic and diaphoretic with stable BP.  I have spoken with Dr. Allyson Sabal who is on for interventional cardiology.  I will give Lopressor 2.5mg  IV now to slow heart rate and see if ST elevation improves, treat with morphine 2mg  and send off set of stat markers.  If ST elevation does not improve and/or enzymes become higher than last set he will take patient back to cath lab.

## 2011-03-07 NOTE — Progress Notes (Signed)
Patient Name: Kristy Sloan Date of Encounter: 03/07/2011, 7:33 AM    Subjective  Recurrent pain this AM worse on inspiration. ST elevations on EKG c/w prior. Received IV lopressor/morphine overnight with some relief of pain. ASA helped yesterday.   Objective    Physical Exam: Filed Vitals:   03/07/11 0600  BP: 116/48  Pulse: 118  Temp:   Resp: 25   General: Well developed, well nourished, in no acute distress. Head: Normocephalic, atraumatic, sclera non-icteric, no xanthomas, nares are without discharge.  Neck: Negative for carotid bruits. JVD not elevated. Lungs: Clear bilaterally to auscultation without wheezes, rales, or rhonchi. Breathing is unlabored. Heart: RRR S1 S2 without murmurs, rubs, or gallops.  Abdomen: Soft, non-tender, non-distended with normoactive bowel sounds. No hepatomegaly. No rebound/guarding. No obvious abdominal masses. Msk:  Strength and tone appear normal for age. Extremities: No clubbing or cyanosis. No edema.  Distal pedal pulses are 2+ and equal bilaterally. Neuro: Alert and oriented X 3. Moves all extremities spontaneously. Psych:  Responds to questions appropriately with a normal affect.    Intake/Output Summary (Last 24 hours) at 03/07/11 0733 Last data filed at 03/07/11 0400  Gross per 24 hour  Intake 2345.83 ml  Output   1100 ml  Net 1245.83 ml    Labs:  Basename 03/06/11 0255 03/05/11 1535 03/05/11 1530  NA 135 139 --  K 3.9 3.7 --  CL 105 108 --  CO2 22 -- 21  GLUCOSE 129* 126* --  BUN 8 9 --  CREATININE 0.69 0.60 --  CALCIUM 8.7 -- 8.7  MG -- -- --  PHOS -- -- --    Basename 03/05/11 1530  AST 44*  ALT 23  ALKPHOS 77  BILITOT 0.4  PROT 6.6  ALBUMIN 3.1*   No results found for this basename: LIPASE:2,AMYLASE:2 in the last 72 hours  Basename 03/06/11 0255 03/05/11 1535 03/05/11 1530  WBC 16.8* -- 17.7*  NEUTROABS -- -- --  HGB 14.0 15.6* --  HCT 43.0 46.0 --  MCV 86.5 -- 84.4  PLT 273 -- 281    Basename  03/07/11 0530 03/06/11 2040 03/06/11 1127 03/06/11 0915  CKTOTAL 224* 308* 542* 654*  CKMB 13.6* 34.6* 84.4* 105.2*  TROPONINI 6.41* 7.86* 12.19* 13.36*    Basename 03/05/11 1530  HGBA1C 5.3    Basename 03/05/11 1530  CHOL 237*  HDL 34*  LDLCALC 177*  TRIG 128  CHOLHDL 7.0   Radiology/Studies:  1. Chest Port 1 View 03/05/2011  *RADIOLOGY REPORT*  Clinical Data: Post catheterization.  PORTABLE CHEST - 1 VIEW  Comparison: 03/05/2011.  Findings: 1903 hours.  There are persistent low lung volumes.  The lungs remain clear.  There is no pleural effusion or pneumothorax. Heart size and mediastinal contours are normal.  Multiple telemetry leads overlie the chest. There is a possible right shoulder loose body.  IMPRESSION: Stable examination.  No active cardiopulmonary process.  Original Report Authenticated By: Gerrianne Scale, M.D.     Assessment and Plan   1. Late presented acute STEMI with prox LAD lesion with distal embolization (prior to PCI) and occlusion of apical LAD s/p Promus DES to prox ruptured plaque.  --> had recurrent CP / ST elevation yesterday and was taken back to the lab with patency of stent - again had recurrent CP today felt c/w post-MI pericarditis. Enzymes trending down. Stat echo this AM shows good LV function, negative for any myocardial rupture. Dr. Riley Kill assessed at bedside - per guidelines will tx  with ASA 650mg  q6hr x 4 doses and resume baby ASA tomorrow if stable. Avoid steroids/NSAIDs in light of increased risk of rupture. Will continue to monitor carefully.   2. Hypotension - per d/w Dr. Riley Kill, will hydrate with ongoing IVF - note normal LV function.  3. Fever - may be related to pericarditis. Will check UA/Bcx as precautionary measure. Follow CBC.  4. DVT ppx - SCD's have been ordered. Avoid anticoag for now given pericarditis (risk of hemorrhagic conversion per [facility]).  I spoke with family to update them on recent improvements. Patient is now laughing &  resting comfortably with family.  Signed, Ronie Spies PA-C  Patient seen and examined.  One hour of time at bedside and review.  Urgent echo performed and reviewed.  Films again reviewed with interventional colleague.   Patient had restudy yesterday with TIMI 3 flow to the apex with apical occlusion.  Current pain is with inspiration.  Echo at bedside shows some hyperdynamic LV with apical WM abnormality.  ? Rub at apex.  ECG shows persistent STE without T wave changes.  Enzymes trending down.  Currently has fever.  Echo also shows good RV function.  Findings still support post MI pericarditis with inspiratory pain, fever, tachycardia, persistent STE without T changes.  Echo does not suggest myocardial rupture.  Recath does not suggest prox LAD occlusion at stent site.  BP has stabilized with fluids.  CXR shows left base opacity, but this is likely atelectasis, though pneumonia cannot be excluded.   Per ACC guidelines, will treat initially with higher dose ASA, and monitor through the day---no steroids or NSAIDS.  May need antibiotic coverage, but will culture and defer based on clinical likelihood.  Will try to get up if she is better.  Would like to add DVT prophylaxis, but with sig pericarditis suggestion post MI will avoid today.  SCDs have been ordered.   Will monitor closely in the unit.  I have spoken with family members who are here and detailed them on her current status.    Shawnie Pons, MD, [facility], [place] 8:12 AM 03/07/2011

## 2011-03-07 NOTE — Progress Notes (Signed)
   CARE MANAGEMENT NOTE 03/07/2011  Patient:  Kristy Sloan, Kristy Sloan   Account Number:  [id number]  Date Initiated:  03/07/2011  Documentation initiated by:  GRAVES-BIGELOW,Esperansa Sarabia  Subjective/Objective Assessment:   Presented to [facility] with cp. Tx to [facility] for emergent cath- stemi. Symptoms suggest post MI pericarditis. Pain only with inspiration. Pt in the unit and will be moniotred closely.     Action/Plan:   CM attempted to speak to pt, however resting comfortably at this time. Pt shows in system as self pay. At d/c please d/c pt on all generic medications if possible. Unsure if pt is from [place]-will try to set her up with [facility] for F/u PCP apt.   Anticipated DC Date:  03/14/2011   Anticipated DC Plan:  HOME/SELF CARE  In-house referral  Financial Counselor      DC Planning Services  CM consult  [facility]  Medication Assistance      Choice offered to / List presented to:             Status of service:  In process, will continue to follow Medicare Important Message given?   (If response is "NO", the following Medicare IM given date fields will be blank) Date Medicare IM given:   Date Additional Medicare IM given:    Discharge Disposition:    Per UR Regulation:    Comments:  03-07-11 [address] Tomi Bamberger, RN,BSN [phone] CM did place a call to Financial Counselor about helping pt with bill.

## 2011-03-07 NOTE — Progress Notes (Signed)
Pt awoke with 10/10 chest pain.  Pt diaphoretic.  Stat EKG done and 2mg  morphine given IV.  [facility] fellow paged twice with no response.  [facility] MD notified of pt's status and agreed to contact [facility] fellow again.  Dr. Mayford Knife returned page and agreed to see pt.

## 2011-03-07 NOTE — Progress Notes (Signed)
   CARE MANAGEMENT NOTE 03/07/2011  Patient:  Kristy Sloan, Kristy Sloan   Account Number:  [id number]  Date Initiated:  03/07/2011  Documentation initiated by:  GRAVES-BIGELOW,Karolyn Messing  Subjective/Objective Assessment:   Presented to [facility] with cp. Tx to [facility] for emergent cath- stemi. Symptoms suggest post MI pericarditis. Pain only with inspiration. Pt in the unit and will be moniotred closely.     Action/Plan:   CM attempted to speak to pt, however resting comfortably at this time. Pt shows in system as self pay. At d/c please d/c pt on all generic medications if possible. Unsure if pt is from [place]-will try to set her up with [facility] for F/u PCP apt.   Anticipated DC Date:  03/14/2011   Anticipated DC Plan:  HOME/SELF CARE  In-house referral  Financial Counselor      DC Planning Services  CM consult  Medication Assistance      Choice offered to / List presented to:             Status of service:  Completed, signed off Medicare Important Message given?   (If response is "NO", the following Medicare IM given date fields will be blank) Date Medicare IM given:   Date Additional Medicare IM given:    Discharge Disposition:  HOME/SELF CARE  Per UR Regulation:    Comments:  03-07-11 1521 Tomi Bamberger, RN,BSN [phone] CM went back to speak to pt and she was awake. Pt is from [facility] and PCP is [facility]. Currently unable to work and is set for disability trial for Jan 16th 2013. 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 and CM called to check price on generic plavix and price will be 11.31. Please write for all generic meds at d/c to keep cost down for pt. Thanks. No other needs assessed by CM at this time.  03-07-11 [address] [place] [phone] CM did place a call to Financial Counselor about helping pt with bill.

## 2011-03-07 NOTE — Progress Notes (Signed)
See Dr. Norris Cross note from this morning - she was covering fellow call for our group. Overnight pt had recurrent pain/ST elevation - she discussed case with Dr. Allyson Sabal who recommended to trend enzymes. CK, MB, troponin all slightly lower this AM than overnight. I was called by the nurse to see patient regarding continued CP with EKG changes and hypotension. SBP 73/55 after receiving morphine and lopressor this AM. I spoke with Dr. Riley Kill and we are obtaining a stat echo. When I arrived, Dr. Mayford Knife was assessing patient at bedside who was somewhat more comfortable but still having pain. Pressure up to 89 systolic now. Dr. Mayford Knife did not hear any rales on exam and plans to give 250 CC NS bolus. Note temp of 102.3. Will check ucx, bcx.  Bedside echo shows good LV function, no evidence of myocardial rupture, RV looks okay. See thoughts by Dr. Riley Kill below.  Dayna Dunn PA-C

## 2011-03-07 NOTE — Progress Notes (Signed)
She appears much less toxic this pm, and HR is down in the high 80's even without giving her beta blockers.  Her BP remains soft, and by echo she is hyperdynamic in the noninfarct zone.  Dr. Jens Som mentioned mid cavity obstruction yesterday.  We have been giving her saline boluses, so volume will need to be watched, but with her function she should tolerate this.  EF today is in the range of 60%.  Findings on ECG reflect persistent STE without evolutionary changes in the T waves, and pain associated with inspiration.  We have obtained cultures, and started guideline derived treatment for post MI pericarditis.  Will add PPI for GI prophylaxis per guidelines as well.  Continue to monitor.  Repeat CXR in am to exclude developing pneumonia.  Will likely add subQ lovenox in low dose for DVT with the understanding that we need to be careful with anticoagulation.  Will watch closely.  I have apprised her family of her findings, of her current status, and made them aware of the risk of myocardial rupture as a potential complication of delayed presentation.  The patient is also aware.  Kristy Sloan 8:15 PM 03/07/2011

## 2011-03-08 ENCOUNTER — Inpatient Hospital Stay (HOSPITAL_COMMUNITY): Payer: Self-pay

## 2011-03-08 DIAGNOSIS — I959 Hypotension, unspecified: Secondary | ICD-10-CM

## 2011-03-08 DIAGNOSIS — R079 Chest pain, unspecified: Secondary | ICD-10-CM

## 2011-03-08 DIAGNOSIS — D649 Anemia, unspecified: Secondary | ICD-10-CM

## 2011-03-08 LAB — CBC
Hemoglobin: 9.7 g/dL — ABNORMAL LOW (ref 12.0–15.0)
MCH: 28.3 pg (ref 26.0–34.0)
MCV: 86.7 fL (ref 78.0–100.0)
Platelets: 188 10*3/uL (ref 150–400)
Platelets: 183 10*3/uL (ref 150–400)
RBC: 3.39 MIL/uL — ABNORMAL LOW (ref 3.87–5.11)
RBC: 3.32 MIL/uL — ABNORMAL LOW (ref 3.87–5.11)
RDW: 15.1 % (ref 11.5–15.5)
WBC: 11.1 10*3/uL — ABNORMAL HIGH (ref 4.0–10.5)

## 2011-03-08 LAB — BASIC METABOLIC PANEL
GFR calc Af Amer: >90 mL/min (ref >90)
GFR calc non Af Amer: >90 mL/min (ref >90)
Glucose, Bld: 92 mg/dL (ref 70–99)
Potassium: 3.8 mEq/L (ref 3.5–5.1)
Sodium: 136 mEq/L (ref 135–145)

## 2011-03-08 LAB — ABO/RH: ABO/RH(D): A POS

## 2011-03-08 LAB — TYPE AND SCREEN

## 2011-03-08 MED ORDER — ASPIRIN 325 MG PO TABS
325.0000 mg | ORAL_TABLET | Freq: Four times a day (QID) | ORAL | Status: AC
  Administered 2011-03-08 – 2011-03-09 (×6): 325 mg via ORAL
  Filled 2011-03-08 (×6): qty 1

## 2011-03-08 MED ORDER — CIPROFLOXACIN IN D5W 400 MG/200ML IV SOLN
400.0000 mg | Freq: Two times a day (BID) | INTRAVENOUS | Status: DC
  Administered 2011-03-08 – 2011-03-09 (×3): 400 mg via INTRAVENOUS
  Filled 2011-03-08 (×6): qty 200

## 2011-03-08 MED ORDER — SODIUM CHLORIDE 0.9 % IV SOLN
INTRAVENOUS | Status: AC
  Administered 2011-03-08 (×2): via INTRAVENOUS

## 2011-03-08 MED ORDER — SODIUM CHLORIDE 0.9 % IV BOLUS (SEPSIS)
250.0000 mL | Freq: Once | INTRAVENOUS | Status: AC
  Administered 2011-03-08: 250 mL via INTRAVENOUS

## 2011-03-08 MED ORDER — SODIUM CHLORIDE 0.9 % IV BOLUS (SEPSIS): 250.0000 mL | Freq: Once | INTRAVENOUS | Status: DC

## 2011-03-08 MED ORDER — SODIUM CHLORIDE 0.9 % IV SOLN
INTRAVENOUS | Status: DC
  Administered 2011-03-09: 12:00:00 via INTRAVENOUS

## 2011-03-08 NOTE — Progress Notes (Signed)
Bp 80-95  Still with chest pain  Continue current plan

## 2011-03-08 NOTE — Progress Notes (Signed)
She appears much less toxic at this point in time.  Still with some inspiratory chest pain.  Pain seems worse with inspiration mostly.  BP currently 105/43, HR in the 80s, metoprolol has been on hold through the night.   Labs last night demonstrate Hgb 10.  Abdomen is soft, groin sites look good, no lower back pain. CBC repeat due this am at 5 am.  If any sig drop will do CT to rule out RP bleed, but not on any antithrombins, and no clinical symptoms to suggest with benign exam.   Will give some additional fluids today at lower rate, and will continue to have our team monitor her closely.  Shawnie Pons 4:36 AM 03/08/2011

## 2011-03-08 NOTE — Progress Notes (Signed)
I am the PA on telephone call tonight and was reviewing the chart to keep apprised of today's events in case I am called for questions since I was aware of her tenuous course yesterday. I saw her UA came back with positive nitrites and trace leuks. With fever, elevated WBC, tachy/hypotension, I contacted Dr. Myrtis Ser to discuss this further and ultimately we have elected to place her on IV Cipro. Dr. Myrtis Ser plans to discuss this further tomorrow with Dr. Graciela Husbands. UCx is ordered, await results.   Dayna Dunn PA-C 03/08/2011 8:28 PM

## 2011-03-08 NOTE — Progress Notes (Signed)
Patient Name: Kristy Sloan      SUBJECTIVE: The patient continues to have pleuritic chest pain. She was seen by Dr. Riley Kill a couple of hours ago who called me just moments ago and uprising of the fact that the pain is considerably improved compared to yesterday following up titration of her aspirin. She has no back pain.  Her blood pressure continues to be "soft". It also seems to be very able.  Past Medical History  Diagnosis Date  . Anxiety   . Spinal stenosis   . Fibromyalgia   . Restless legs syndrome     PHYSICAL EXAM Filed Vitals:   03/08/11 0400 03/08/11 0500 03/08/11 0600 03/08/11 0700  BP: 105/43 94/44 100/53 79/44  Pulse: 97 94 97 93  Temp:      TempSrc:      Resp: 21 22 21 21   Height:      Weight:      SpO2: 98% 99% 98% 98%    General appearance: alert, cooperative, appears older than stated age, mild distress and moderately obese Lungs: clear to auscultation bilaterally Heart: regular rate and rhythm, S1, S2 normal, no murmur, click, rub or gallop Abdomen: soft, non-tender; bowel sounds normal; no masses,  no organomegaly Extremities: extremities normal, atraumatic, no cyanosis or edema Skin: Skin color, texture, turgor normal. No rashes or lesions Neurologic: Alert and oriented X 3, normal strength and tone. Normal symmetric reflexes. Normal coordination and gait  TELEMETRY: Reviewed telemetry pt in nsr:    Intake/Output Summary (Last 24 hours) at 03/08/11 0750 Last data filed at 03/08/11 0545  Gross per 24 hour  Intake   2540 ml  Output    600 ml  Net   1940 ml    LABS: Basic Metabolic Panel:  Lab 03/08/11 1610 03/07/11 2027 03/06/11 0255 03/05/11 1535 03/05/11 1530  NA 136 134* 135 139 135  K 3.8 4.5 3.9 3.7 3.5  CL 109 107 105 108 103  CO2 21 20 22  -- 21  GLUCOSE 92 124* 129* 126* 119*  BUN 15 16 8 9 9   CREATININE 0.76 0.87 0.69 0.60 0.60  CALCIUM 7.9* 7.9* -- -- --  MG -- -- -- -- --  PHOS -- -- -- -- --   Cardiac Enzymes:  Basename  03/07/11 0530 03/06/11 2040 03/06/11 1127  CKTOTAL 224* 308* 542*  CKMB 13.6* 34.6* 84.4*  CKMBINDEX -- -- --  TROPONINI 6.41* 7.86* 12.19*   CBC:  Lab 03/08/11 0500 03/07/11 2027 03/06/11 0255 03/05/11 1535 03/05/11 1530  WBC 11.1* 13.5* 16.8* -- 17.7*  NEUTROABS -- -- -- -- --  HGB 9.7* 10.2* 14.0 15.6* 14.5  HCT 29.9* 31.6* 43.0 46.0 42.9  MCV 88.2 88.0 86.5 -- 84.4  PLT 188 181 273 -- 281   PROTIME:  Basename 03/05/11 1530  LABPROT 62.0*  INR 7.11*   Liver Function Tests:  Basename 03/05/11 1530  AST 44*  ALT 23  ALKPHOS 77  BILITOT 0.4  PROT 6.6  ALBUMIN 3.1*   No results found for this basename: LIPASE:2,AMYLASE:2 in the last 72 hours BNP: No components found with this basename: POCBNP:3 D-Dimer: No results found for this basename: DDIMER:2 in the last 72 hours Hemoglobin A1C:  Basename 03/05/11 1530  HGBA1C 5.3   Fasting Lipid Panel:  Basename 03/05/11 1530  CHOL 237*  HDL 34*  LDLCALC 177*  TRIG 128  CHOLHDL 7.0  LDLDIRECT --      ASSESSMENT AND PLAN: The patient continues to have pain  and low blood pressure. She remains tachycardic. This is precluded the use of beta blockers. The cause of the low blood pressure remains unclear. She is thought to have some degree of hyperdynamic LV function; this would imply that there is a volume dependent aspect to her blood pressure. Her hematocrit is drifting gradually down. The question has been raised as to whether she is a retroperitoneal bleed. Will obtain a CT scan to exclude that per Dr. Riley Kill. The brackish taste in her mouth has been addressed with the addition of a PPI. We will follow her stool for bleeding.   Continue generous IV fluids,serial Hgb  Patient Active Hospital Problem List: Acute anterior wall MI (03/06/2011)   Chest pain (03/06/2011)   Fibromyalgia (03/06/2011)  Hypotension      Signed, Sherryl Manges MD  03/08/2011

## 2011-03-09 DIAGNOSIS — I2109 ST elevation (STEMI) myocardial infarction involving other coronary artery of anterior wall: Secondary | ICD-10-CM

## 2011-03-09 LAB — URINE CULTURE
Colony Count: 45000
Culture  Setup Time: 201301060018

## 2011-03-09 LAB — BASIC METABOLIC PANEL
CO2: 24 mEq/L (ref 19–32)
Calcium: 8.1 mg/dL — ABNORMAL LOW (ref 8.4–10.5)
Creatinine, Ser: 0.74 mg/dL (ref 0.50–1.10)
GFR calc Af Amer: >90 mL/min (ref >90)
GFR calc non Af Amer: >90 mL/min (ref >90)
Sodium: 142 mEq/L (ref 135–145)

## 2011-03-09 LAB — CBC
Platelets: 199 10*3/uL (ref 150–400)
RBC: 3.4 MIL/uL — ABNORMAL LOW (ref 3.87–5.11)
RDW: 15.3 % (ref 11.5–15.5)
WBC: 9 10*3/uL (ref 4.0–10.5)

## 2011-03-09 MED ORDER — HYDROCODONE-ACETAMINOPHEN 10-325 MG PO TABS
1.0000 | ORAL_TABLET | Freq: Four times a day (QID) | ORAL | Status: DC | PRN
  Administered 2011-03-09 – 2011-03-15 (×18): 1 via ORAL
  Filled 2011-03-09 (×17): qty 1

## 2011-03-09 NOTE — Progress Notes (Signed)
Patient Name: Kristy Sloan      SUBJECTIVE: The patient continues to have pleuritic chest pain. She was seen by Dr. Riley Kill a couple of hours ago who called me just moments ago and uprising of the fact that the pain is considerably improved compared to yesterday following up titration of her aspirin. She has no back pain.  Denies dysuria; overall feels a good deal better.  Past Medical History  Diagnosis Date  . Anxiety   . Spinal stenosis   . Fibromyalgia   . Restless legs syndrome     PHYSICAL EXAM Filed Vitals:   03/09/11 0400 03/09/11 0500 03/09/11 0605 03/09/11 0743  BP: 98/45 94/48 100/63   Pulse: 86 89 92   Temp:    98.6 F (37 C)  TempSrc:    Oral  Resp: 20 21 19    Height:      Weight:      SpO2: 100% 100% 99%      Temp (24hrs), Avg:98.5 F (36.9 C), Min:98 F (36.7 C), Max:99.5 F (37.5 C) BP 100/63  Pulse 92  Temp(Src) 98.6 F (37 C) (Oral)  Resp 19  Ht 5\' 3"  (1.6 m)  Wt 238 lb 12.1 oz (108.3 kg)  BMI 42.29 kg/m2  SpO2 99%   General appearance: alert, cooperative, appears older than stated age, mild distress and moderately obese Lungs: clear to auscultation bilaterally Heart: regular rate and rhythm, S1, S2 normal, no murmur, click, rub or gallop Abdomen: soft, non-tender; bowel sounds normal; no masses,  no organomegaly Extremities: extremities normal, atraumatic, no cyanosis or edema Skin: Skin color, texture, turgor normal. No rashes or lesions Neurologic: Alert and oriented X 3, normal strength and tone. Normal symmetric reflexes. Normal coordination and gait  TELEMETRY: Reviewed telemetry pt in nsr:    Intake/Output Summary (Last 24 hours) at 03/09/11 0750 Last data filed at 03/09/11 0600  Gross per 24 hour  Intake   3450 ml  Output   2550 ml  Net    900 ml    LABS: Basic Metabolic Panel:  Lab 03/08/11 1610 03/07/11 2027 03/06/11 0255 03/05/11 1535 03/05/11 1530  NA 136 134* 135 139 135  K 3.8 4.5 3.9 3.7 3.5  CL 109 107 105 108  103  CO2 21 20 22  -- 21  GLUCOSE 92 124* 129* 126* 119*  BUN 15 16 8 9 9   CREATININE 0.76 0.87 0.69 0.60 0.60  CALCIUM 7.9* 7.9* -- -- --  MG -- -- -- -- --  PHOS -- -- -- -- --   Cardiac Enzymes:  Basename 03/07/11 0530 03/06/11 2040 03/06/11 1127  CKTOTAL 224* 308* 542*  CKMB 13.6* 34.6* 84.4*  CKMBINDEX -- -- --  TROPONINI 6.41* 7.86* 12.19*   CBC:  Lab 03/09/11 0600 03/08/11 1150 03/08/11 0500 03/07/11 2027 03/06/11 0255 03/05/11 1535 03/05/11 1530  WBC 9.0 12.3* 11.1* 13.5* 16.8* -- 17.7*  NEUTROABS -- -- -- -- -- -- --  HGB 9.6* 9.4* 9.7* 10.2* 14.0 15.6* 14.5  HCT 29.8* 28.8* 29.9* 31.6* 43.0 46.0 42.9  MCV 87.6 86.7 88.2 88.0 86.5 -- 84.4  PLT 199 183 188 181 273 -- 281   CT scan showed no retroperitoneal hematoma  ASSESSMENT AND PLAN: The patient continues to have pain and low blood pressure althtoubhg this is improved She remains tachycardic but less so This  cntinues to preculude  use of beta blockers. The cause of the low blood pressure remains unclear. She is thought to have some degree of  hyperdynamic LV function; this would imply that there is a volume dependent aspect to her blood pressure. Her hematocrit is drifting gradually down. The question has been raised as to whether she is a retroperitoneal bleed.  Continue generous IV fluids,serial Hgb Hopefully by morning we can begin to mobilize. We will get her to a chair today. There is some orthostatic lightheadedness likely related to her relatively low blood pressure and her 1 week of bed rest.  Patient Active Hospital Problem List: Acute anterior wall MI (03/06/2011)    Chest pain (03/06/2011)   Fibromyalgia (03/06/2011)  Hypotension   Abnormal UA contnue cipro until culture back but i suspect will be neg with no pyuria   Signed, Sherryl Manges MD  03/09/2011

## 2011-03-10 ENCOUNTER — Inpatient Hospital Stay (HOSPITAL_COMMUNITY): Payer: Self-pay

## 2011-03-10 DIAGNOSIS — I2109 ST elevation (STEMI) myocardial infarction involving other coronary artery of anterior wall: Secondary | ICD-10-CM

## 2011-03-10 LAB — BASIC METABOLIC PANEL
BUN: 7 mg/dL (ref 6–23)
CO2: 25 mEq/L (ref 19–32)
Calcium: 8.1 mg/dL — ABNORMAL LOW (ref 8.4–10.5)
Creatinine, Ser: 0.67 mg/dL (ref 0.50–1.10)
GFR calc non Af Amer: >90 mL/min (ref >90)
Glucose, Bld: 91 mg/dL (ref 70–99)
Sodium: 137 mEq/L (ref 135–145)

## 2011-03-10 LAB — CBC
MCH: 27.3 pg (ref 26.0–34.0)
MCHC: 31.3 g/dL (ref 30.0–36.0)
MCV: 87.2 fL (ref 78.0–100.0)
Platelets: 238 10*3/uL (ref 150–400)
RBC: 3.66 MIL/uL — ABNORMAL LOW (ref 3.87–5.11)

## 2011-03-10 MED ORDER — METOPROLOL TARTRATE 12.5 MG HALF TABLET
12.5000 mg | ORAL_TABLET | Freq: Two times a day (BID) | ORAL | Status: DC
  Administered 2011-03-10 – 2011-03-11 (×4): 12.5 mg via ORAL
  Filled 2011-03-10 (×7): qty 1

## 2011-03-10 MED ORDER — CIPROFLOXACIN HCL 250 MG PO TABS
250.0000 mg | ORAL_TABLET | Freq: Two times a day (BID) | ORAL | Status: AC
  Administered 2011-03-10 – 2011-03-11 (×3): 250 mg via ORAL
  Filled 2011-03-10 (×3): qty 1

## 2011-03-10 NOTE — Progress Notes (Signed)
CARDIAC REHAB PHASE I   PRE:  Rate/Rhythm: 98 SR   BP:  Supine:   Sitting: 109/80  Standing:    SaO2: 95% 2L  MODE:  Ambulation: 250 ft   POST:  Rate/Rhythem: 108 ST  BP:  Supine:   Sitting: 123/54  Standing:    SaO2: 98% 2L  0810 - 0850 Pt ambulated with RW and 2 assist steady. Rest break x 2. C/o cp 4 out of 10 ongoing, didn't change during walk. Back to chair with feet up, call bell in reach.   Rosalie Doctor

## 2011-03-10 NOTE — Progress Notes (Addendum)
Cardiology Progress Note Patient Name: Kristy Sloan Date of Encounter: 03/10/2011, 6:57 AM     Subjective  No overnight events. Patient was able to get up to a chair yesterday. She is feeling better today than the past couple of days, but continues to have pleuritic chest pain with movement and deep inspiration. No shortness of breath or dysuria.   Objective   Telemetry: Sinus rhythm 80s-90s  Medications: . aspirin  81 mg Oral Daily  . aspirin  325 mg Oral QID  . ciprofloxacin  400 mg Intravenous Q12H  . clopidogrel  75 mg Oral Q breakfast  . gabapentin  600 mg Oral TID  . metoprolol tartrate  25 mg Oral TID  . Milnacipran  100 mg Oral BID  . mulitivitamin with minerals  1 tablet Oral Daily  . pantoprazole  40 mg Oral Q0600  . rOPINIRole  2 mg Oral QHS  . rosuvastatin  40 mg Oral q1800  . sodium chloride  250 mL Intravenous Once   Physical Exam: Temp:  [98 F (36.7 C)-98.6 F (37 C)] 98 F (36.7 C) (01/07 0400) Pulse Rate:  [84-96] 89  (01/07 0600) Resp:  [16-25] 21  (01/07 0300) BP: (87-121)/(41-73) 112/49 mmHg (01/07 0600) SpO2:  [93 %-100 %] 97 % (01/07 0600)  General: Overweight white female, in no acute distress. Head: Normocephalic, atraumatic, sclera non-icteric, nares are without discharge.  Neck: Supple. Negative for  JVD Lungs: Clear bilaterally to auscultation without wheezes, rales, or rhonchi. Breathing is unlabored. Heart: RRR S1 S2 without murmurs, rubs, or gallops.  Abdomen: Soft, non-tender, non-distended with normoactive bowel sounds. No rebound/guarding. No obvious abdominal masses. Msk:  Strength and tone appear normal for age. Extremities: No edema. No clubbing or cyanosis. Distal pedal pulses are 2+ and equal bilaterally. Neuro: Alert and oriented X 3. Moves all extremities spontaneously. Psych:  Responds to questions appropriately with a normal affect.  Intake/Output Summary (Last 24 hours) at 03/10/11 0657 Last data filed at 03/10/11 0600  Gross per 24 hour  Intake   3660 ml  Output   2300 ml  Net   1360 ml   Labs:  Basename 03/09/11 0600 03/08/11 0500  NA 142 136  K 4.0 3.8  CL 111 109  CO2 24 21  GLUCOSE 78 92  BUN 10 15  CREATININE 0.74 0.76  CALCIUM 8.1* 7.9*   Basename 03/09/11 0600 03/08/11 1150  WBC 9.0 12.3*  NEUTROABS -- --  HGB 9.6* 9.4*  HCT 29.8* 28.8*  MCV 87.6 86.7  PLT 199 183     03/05/2011 15:30 03/05/2011 20:53 03/06/2011 02:55 03/06/2011 09:15 03/06/2011 11:27 03/06/2011 20:40 03/07/2011 05:30  CK, MB 108.8 (HH) 135.7 (HH) 138.6 (HH) 105.2 (HH) 84.4 (HH) 34.6 (HH) 13.6 (HH)  CK Total 692 (H) 848 (H) 838 (H) 654 (H) 542 (H) 308 (H) 224 (H)  Troponin I 8.89 (HH) 10.82 (HH) 13.91 (HH) 13.36 (HH) 12.19 (HH) 7.86 (HH) 6.41 (HH)   Radiology/Studies:  Ct Abdomen Pelvis Wo Contrast 03/08/2011 Findings: Small bilateral pleural effusions.  Bilateral lower lobe consolidation and volume loss.  Small pericardial effusion measuring 7 mm in maximum thickness.  Small umbilical hernia containing fat.  Unremarkable noncontrasted appearance of the liver, spleen, pancreas, gallbladder, right adrenal gland, kidneys, urinary bladder, uterus and ovaries.  1.6 x 1.5 cm low density left adrenal mass.  This measures -1 Hounsfield units in density.  No gastrointestinal abnormalities or enlarged lymph nodes.  Normal appearing appendix.  Mild lumbar and lower thoracic spine degenerative changes.  IMPRESSION:  1.  Small bilateral pleural effusions and bilateral lower lobe atelectasis/pneumonia. 2.  Small pericardial effusion. 3.  1.6 cm left adrenal adenoma. 4.  No retroperitoneal hemorrhage seen. 5.  Small umbilical hernia containing fat.   Dg Chest Port 1v Same Day 03/08/2011 Findings: Low lung volumes with vascular crowding and bibasilar atelectasis. No pleural effusion or pneumothorax.  Mild cardiomegaly.  IMPRESSION: Low lung volumes with bibasilar atelectasis.  Mild cardiomegaly.     Assessment and Plan  51 y.o. female w/ no prior  cardiac history who presented to [facility] on 03/05[facility]11 with complaints of chest pain and was found to have an inferolateral STEMI and transferred to [facility] for emergent cath. She was found to have an acute anterior MI with ruptured proximal LAD lesion and distal embolization and occlusion of the apical LAD now s/p DES to proximal ruptured plaque. She had continued chest pain felt most consistent with post-MI pericarditis and which is now improving with ASA.  1. Acute anterior STEMI with prox LAD lesion with distal embolization (prior to PCI) and occlusion of apical LAD s/p Promus DES to prox ruptured plaque. Cont ASA, Plavix, BB, stati  2. Chest pain: Had recurrent CP / ST elevation on 03/06/11 and was taken back to the lab with patency of stent - again had recurrent CP on 03/07/11 felt c/w post-MI pericarditis.  Stat echo showed good LV function, negative for any myocardial rupture. Enzymes trended down. Her chest pain is improving with ASA. Will cont to avoid steroids/NSAIDs in light of increased risk of rupture. Will continue to monitor carefully.  3. Hypotension - SBP has remained consistently above over the last 24hrs. She was able to get out of bed and to the chair yesterday, but had some lightheadedness.   4. Anemia - H&H stable over the last couple of days. 9.6/29.8 yesterday. CT negative for retroperitoneal bleed. Cont to monitor  5. Fever - Afebrile over the last 24hrs. UA with nitrites and leuks on 03/08/11 and started on IV cipro. UCx with mult bacterial morphotypes present, non predominant, ? Contaminated specimen. Patient denies dysuria or hematuria.   6. DVT ppx - SCD's have been ordered. Avoid anticoag for now given pericarditis (risk of hemorrhagic conversion per [facility]).   Signed, HOPE, JESSICA PA-C  Patient seen.  She is dramatically improved over the weekend.  Walked 280 feet without limitation.  Still has some chest pain, but we would expect that to gradually improve  over the next several days.  No loud rub on exam, but pain is pleuritic.  Will recheck CBC tomorrow, and get repeat CXR.  Continue to ambulate, and convert to saline lock.  Start back some beta blocker at this point in time.    Shawnie Pons 9:25 AM 03/10/2011

## 2011-03-11 DIAGNOSIS — I2109 ST elevation (STEMI) myocardial infarction involving other coronary artery of anterior wall: Secondary | ICD-10-CM

## 2011-03-11 LAB — BASIC METABOLIC PANEL
CO2: 25 mEq/L (ref 19–32)
Calcium: 8.6 mg/dL (ref 8.4–10.5)
Creatinine, Ser: 0.63 mg/dL (ref 0.50–1.10)
GFR calc non Af Amer: >90 mL/min (ref >90)
Glucose, Bld: 88 mg/dL (ref 70–99)

## 2011-03-11 LAB — CBC
MCH: 27.5 pg (ref 26.0–34.0)
MCV: 85.9 fL (ref 78.0–100.0)
Platelets: 282 10*3/uL (ref 150–400)
RDW: 14.3 % (ref 11.5–15.5)
WBC: 7 10*3/uL (ref 4.0–10.5)

## 2011-03-11 MED ORDER — MAGIC MOUTHWASH
5.0000 mL | Freq: Three times a day (TID) | ORAL | Status: DC
  Administered 2011-03-11 – 2011-03-15 (×11): 5 mL via ORAL
  Filled 2011-03-11 (×13): qty 5

## 2011-03-11 NOTE — Progress Notes (Signed)
Subjective:  Much improved.  However, still some chest and back pain.  Usually worse with inspiration.  Continuing to impove overall however.    Objective:  Vital Signs in the last 24 hours: Temp:  [97.3 F (36.3 C)-99.1 F (37.3 C)] 98.5 F (36.9 C) (01/08 0734) Pulse Rate:  [93-98] 93  (01/08 0400) Resp:  [18-22] 20  (01/08 0734) BP: (116-125)/(56-72) 124/56 mmHg (01/08 0400) SpO2:  [94 %-99 %] 96 % (01/08 0734)  Intake/Output from previous day: 01/07 0701 - 01/08 0700 In: 920 [P.O.:720; I.V.:200] Out: 1600 [Urine:1600]   Physical Exam: General: Well developed, well nourished, in no acute distress. Head:  Normocephalic and atraumatic. Lungs: Clear to auscultation and percussion. Heart: Normal S1 and S2.  No murmur, rubs or gallops.  Pulses: Pulses normal in all 4 extremities. Extremities: No clubbing or cyanosis. No edema. Neurologic: Alert and oriented x 3.    Lab Results:  Basename 03/11/11 0452 03/10/11 0633  WBC 7.0 7.8  HGB 10.3* 10.0*  PLT 282 238    Basename 03/11/11 0452 03/10/11 0633  NA 141 137  K 3.5 3.8  CL 107 106  CO2 25 25  GLUCOSE 88 91  BUN 5* 7  CREATININE 0.63 0.67   No results found for this basename: TROPONINI:2,CK,MB:2 in the last 72 hours Hepatic Function Panel No results found for this basename: PROT,ALBUMIN,AST,ALT,ALKPHOS,BILITOT,BILIDIR,IBILI in the last 72 hours No results found for this basename: CHOL in the last 72 hours No results found for this basename: PROTIME in the last 72 hours  Imaging: Dg Chest 2 View  03/10/2011  *RADIOLOGY REPORT*  Clinical Data: MI  CHEST - 2 VIEW  Comparison: 03/08/2011  Findings: Cardiomegaly again noted.  There is patchy bilateral basilar atelectasis or infiltrate.  Probable small left pleural effusion.  No pulmonary edema.  IMPRESSION: Patchy bilateral basilar atelectasis or infiltrate.  Probable small left pleural effusion.  No pulmonary edema.  Original Report Authenticated By: Natasha Mead, M.D.       Assessment/Plan:  Patient Active Hospital Problem List: Acute anterior wall MI (03/06/2011)   Assessment: much improved   Plan: consider recath on Friday Chest pain (03/06/2011)   Assessment: Prob from pericarditis, but need to be vigilant   Plan: continue to monitor.  Transfer to TCU  Anemia (03/08/2011)   Assessment: Hgb coming up.    Plan: monitor.  Recheck in two days.  Hypotension (03/08/2011)   Assessment: resolved.   Plan: adjust meds.       Shawnie Pons, MD, [facility], FSCAI 03/11/2011, 8:48 AM

## 2011-03-11 NOTE — Progress Notes (Signed)
CARDIAC REHAB PHASE I   PRE:  Rate/Rhythm: 103 ST    BP: sitting 132/72    SaO2:   MODE:  Ambulation: 350 ft   POST:  Rate/Rhythm: 112 ST    BP: sitting 127/72     SaO2:   This was 3rd walk today. Very motivated and positive. 0/10 CP/back pain before walk. Toward end of walk c/o 6/10 burning in center of chest. Began going away after walk, in chair. Also had some SOB walking (and talking) with rest x1. 5-7 min after return from walk sts pain is at 5/10, like a nagging, burning pain. Seems comfortable in chair. Will f/u. Gave diet sheet. [phone]  Harriet Masson CES, ACSM

## 2011-03-12 LAB — BASIC METABOLIC PANEL
Calcium: 8.6 mg/dL (ref 8.4–10.5)
Creatinine, Ser: 0.67 mg/dL (ref 0.50–1.10)
GFR calc Af Amer: >90 mL/min (ref >90)
Sodium: 139 mEq/L (ref 135–145)

## 2011-03-12 MED ORDER — ENOXAPARIN SODIUM 30 MG/0.3ML ~~LOC~~ SOLN
30.0000 mg | Freq: Every day | SUBCUTANEOUS | Status: DC
  Administered 2011-03-12 – 2011-03-15 (×4): 30 mg via SUBCUTANEOUS
  Filled 2011-03-12 (×4): qty 0.3

## 2011-03-12 MED ORDER — METOPROLOL TARTRATE 12.5 MG HALF TABLET
12.5000 mg | ORAL_TABLET | Freq: Three times a day (TID) | ORAL | Status: DC
  Administered 2011-03-12 – 2011-03-13 (×6): 12.5 mg via ORAL
  Filled 2011-03-12 (×6): qty 1

## 2011-03-12 NOTE — Progress Notes (Signed)
CARDIAC REHAB PHASE I   PRE:  Rate/Rhythm: 105 ST    BP: sitting 120/70    SaO2:   MODE:  Ambulation: 620 ft   POST:  Rate/Rhythm: 120 ST    BP: sitting 143/72     SaO2:   Pt claimed to have no pain, pressure before walk. Sts that she does have a burning sensation at times when she moves or takes deep breath. Used RW to ambulate one lap (350 ft) and felt good, no c/o except normal SOB. Began 2nd lap and began having what she calls chest heaviness. When asked, sts it is like someone stepping on her. Sts it radiates into the back of her neck then gives her a headache. Sts it is a 7/10. Upon questioning she sts she can differentiate the "burning" she has with deep breaths or movements and this "heaviness" she gets with distance walking.  Wanted to walk whole second lap like this but I discouraged this and so she only walked 2/3 of second lap. HR 120 ST. Upon return to chair sts heaviness is 6/10. Applied 4L O2. 10 min later sts heaviness is 4/10. Sts O2 is helping. Will f/u. [phone]  Harriet Masson CES, ACSM

## 2011-03-12 NOTE — Progress Notes (Signed)
   CARE MANAGEMENT NOTE 03/12/2011  Patient:  Kristy Sloan, Kristy Sloan   Account Number:  [id number]  Date Initiated:  03/07/2011  Documentation initiated by:  GRAVES-BIGELOW,Misty Rago  Subjective/Objective Assessment:   Presented to [facility] with cp. Tx to [facility] for emergent cath- stemi. Symptoms suggest post MI pericarditis. Pain only with inspiration. Pt in the unit and will be moniotred closely.     Action/Plan:   CM attempted to speak to pt, however resting comfortably at this time. Pt shows in system as self pay. At d/c please d/c pt on all generic medications if possible. Unsure if pt is from [place]-will try to set her up with [facility] for F/u PCP apt.   Anticipated DC Date:  03/14/2011   Anticipated DC Plan:  HOME/SELF CARE  In-house referral  Financial Counselor      DC Planning Services  CM consult  Medication Assistance      Choice offered to / List presented to:             Status of service:  Completed, signed off Medicare Important Message given?   (If response is "NO", the following Medicare IM given date fields will be blank) Date Medicare IM given:   Date Additional Medicare IM given:    Discharge Disposition:  HOME/SELF CARE  Per UR Regulation:    Comments:  03-12-11 [address] Tom[place], [place] [phone] MD pt is eligible for zz fund.  Please write rx  for a 3 day supply of medications at d/c. Main pharmacy will fill for 3 days. RN please call RN CM for assistance with this process. CM did call family pharmacy- pt stated that her husband will not be able to work for the next 2 weeks. No unemployeement will be coming into home. Cost of metoprolol 12.5mg  will be 5.50, crestor 40 mg 156.78 please change to cheaper statin at d/c. If sends home on crestor pt will need assistance application from [employer]. neurontin 600 mg 33.96. Plan for re cath on Friday. No other needs assesed by CM at time.  03-07-11 [address] [place], RN,BSN [phone] CM went back to speak  to pt and she was awake. Pt is from [facility] and PCP is [facility]. Currently unable to work and is set for disability trial for Jan 16th 2013. 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 and CM called to check price on generic plavix and price will be 11.31. Please write for all generic meds at d/c to keep cost down for pt. Thanks. No other needs assessed by CM at this time.  03-07-11 [address], [place] [phone] CM did place a call to Financial Counselor about helping pt with bill.

## 2011-03-12 NOTE — Progress Notes (Signed)
Subjective:  She is clearly improved.  Still has a catch when she takes a breath in. Rehab notes reviewed and discussed with patient in detail.  Some burning after walking.  Will assess again today after activity.    Objective:  Vital Signs in the last 24 hours: Temp:  [97.5 F (36.4 C)-98.6 F (37 C)] 98.1 F (36.7 C) (01/09 0839) Resp:  [16-22] 16  (01/09 0839) BP: (105-129)/(54-78) 129/78 mmHg (01/09 0800) SpO2:  [91 %-97 %] 96 % (01/09 0839)  Intake/Output from previous day: 01/08 0701 - 01/09 0700 In: 1200 [P.O.:1200] Out: 3100 [Urine:3100]   Physical Exam: General: Well developed, well nourished, in no acute distress. Head:  Normocephalic and atraumatic. Lungs: Slight basilar crackles.   Heart: Normal S1 and S2.  No murmur, rubs or gallops.  Pulses: Pulses normal in all 4 extremities. Extremities: No clubbing or cyanosis. No edema. Neurologic: Alert and oriented x 3.    Lab Results:  Basename 03/11/11 0452 03/10/11 0633  WBC 7.0 7.8  HGB 10.3* 10.0*  PLT 282 238    Basename 03/12/11 0532 03/11/11 0452  NA 139 141  K 3.9 3.5  CL 103 107  CO2 26 25  GLUCOSE 92 88  BUN 6 5*  CREATININE 0.67 0.63   No results found for this basename: TROPONINI:2,CK,MB:2 in the last 72 hours Hepatic Function Panel No results found for this basename: PROT,ALBUMIN,AST,ALT,ALKPHOS,BILITOT,BILIDIR,IBILI in the last 72 hours No results found for this basename: CHOL in the last 72 hours No results found for this basename: PROTIME in the last 72 hours  Imaging: Dg Chest 2 View  03/10/2011  *RADIOLOGY REPORT*  Clinical Data: MI  CHEST - 2 VIEW  Comparison: 03/08/2011  Findings: Cardiomegaly again noted.  There is patchy bilateral basilar atelectasis or infiltrate.  Probable small left pleural effusion.  No pulmonary edema.  IMPRESSION: Patchy bilateral basilar atelectasis or infiltrate.  Probable small left pleural effusion.  No pulmonary edema.  Original Report Authenticated By: Natasha Mead, M.D.      Assessment/Plan:  Patient Active Hospital Problem List: Acute anterior wall MI (03/06/2011)   Assessment: slowly improved.  Still suspect pericarditis.     Plan: Consider recath and IVUS on Friday, but review films again with colleagues.  Chest pain (03/06/2011)   Assessment: worse with inspiration   Plan: see how she does with walking today Fibromyalgia (03/06/2011)   Assessment: on meds.  Anemia (03/08/2011)   Assessment: improved   Plan: recheck in am Hypotension (03/08/2011)   Assessment: pretty much resolved   Plan: continue ambulation.      Shawnie Pons, MD, [facility], FSCAI 03/12/2011, 9:16 AM

## 2011-03-13 ENCOUNTER — Encounter (HOSPITAL_COMMUNITY): Payer: Self-pay | Admitting: Nurse Practitioner

## 2011-03-13 DIAGNOSIS — Z720 Tobacco use: Secondary | ICD-10-CM

## 2011-03-13 DIAGNOSIS — I309 Acute pericarditis, unspecified: Secondary | ICD-10-CM

## 2011-03-13 DIAGNOSIS — I2510 Atherosclerotic heart disease of native coronary artery without angina pectoris: Secondary | ICD-10-CM

## 2011-03-13 LAB — COMPREHENSIVE METABOLIC PANEL
ALT: 14 U/L (ref 0–35)
AST: 12 U/L (ref 0–37)
Albumin: 2.5 g/dL — ABNORMAL LOW (ref 3.5–5.2)
Calcium: 8.8 mg/dL (ref 8.4–10.5)
Sodium: 138 mEq/L (ref 135–145)
Total Protein: 6.1 g/dL (ref 6.0–8.3)

## 2011-03-13 LAB — CBC
HCT: 34.6 % — ABNORMAL LOW (ref 36.0–46.0)
Hemoglobin: 11 g/dL — ABNORMAL LOW (ref 12.0–15.0)
MCH: 27.5 pg (ref 26.0–34.0)
MCHC: 31.8 g/dL (ref 30.0–36.0)
MCV: 86.5 fL (ref 78.0–100.0)

## 2011-03-13 MED ORDER — MILNACIPRAN HCL 100 MG PO TABS
100.0000 mg | ORAL_TABLET | Freq: Two times a day (BID) | ORAL | Status: DC
  Administered 2011-03-13 – 2011-03-15 (×5): 100 mg via ORAL
  Filled 2011-03-13 (×9): qty 1

## 2011-03-13 NOTE — Progress Notes (Addendum)
Patient Name: Kristy Sloan Date of Encounter: 03/13/2011     Principal Problem:  *Acute anterior wall MI Active Problems:  Chest pain  Fibromyalgia  Anemia  Hypotension  CAD in native artery  Pericarditis, acute  Tobacco abuse    SUBJECTIVE:  Still with intermittent pleuritic chest pain but overall feels well.  Eager to go home.   CURRENT MEDS    . aspirin  81 mg Oral Daily  . clopidogrel  75 mg Oral Q breakfast  . enoxaparin (LOVENOX) injection  30 mg Subcutaneous Daily  . gabapentin  600 mg Oral TID  . magic mouthwash  5 mL Oral TID  . metoprolol tartrate  12.5 mg Oral TID  . Milnacipran  100 mg Oral BID  . mulitivitamin with minerals  1 tablet Oral Daily  . pantoprazole  40 mg Oral Q0600  . rOPINIRole  2 mg Oral QHS  . rosuvastatin  40 mg Oral q1800  . DISCONTD: metoprolol tartrate  12.5 mg Oral BID    OBJECTIVE  Filed Vitals:   03/12/11 2330 03/13/11 0417 03/13/11 0530 03/13/11 0821  BP: 106/59  96/57 103/61  Pulse:    91  Temp: 98.6 F (37 C) 98.2 F (36.8 C)  97.6 F (36.4 C)  TempSrc:  Oral  Oral  Resp:  18    Height:      Weight:      SpO2: 93% 93%  95%    Intake/Output Summary (Last 24 hours) at 03/13/11 0839 Last data filed at 03/13/11 0600  Gross per 24 hour  Intake   1080 ml  Output   2125 ml  Net  -1045 ml   PHYSICAL EXAM  General: Well developed, well nourished, in no acute distress. Head: Normocephalic, atraumatic, sclera non-icteric, no xanthomas, nares are without discharge.  Neck: Supple without bruits or JVD. Lungs:  Resp regular and unlabored, diminished breath sounds in bases, otw cta. Heart: RRR no s3, s4, or murmurs.  No rubs. Abdomen: Soft, non-tender, non-distended, BS + x 4.  Msk:  Strength and tone appears normal for age. Extremities: No clubbing, cyanosis or edema. DP/PT/Radials 2+ and equal bilaterally. Neuro: Alert and oriented X 3. Moves all extremities spontaneously. Psych: Normal  affect.  LABS:  CBC:  Basename 03/13/11 0507 03/11/11 0452  WBC 7.2 7.0  NEUTROABS -- --  HGB 11.0* 10.3*  HCT 34.6* 32.2*  MCV 86.5 85.9  PLT 360 282   Basic Metabolic Panel:  Basename 03/13/11 0507 03/12/11 0532  NA 138 139  K 4.0 3.9  CL 104 103  CO2 28 26  GLUCOSE 98 92  BUN 6 6  CREATININE 0.74 0.67  CALCIUM 8.8 8.6  MG -- --  PHOS -- --   Liver Function Tests:  Basename 03/13/11 0507  AST 12  ALT 14  ALKPHOS 74  BILITOT 0.2*  PROT 6.1  ALBUMIN 2.5*   Lab Results  Component Value Date   CHOL 237* 03/05/2011   HDL 34* 03/05/2011   LDLCALC 177* 03/05/2011   TRIG 128 03/05/2011   CHOLHDL 7.0 03/05/2011    TELE SR/ST, st elev  ASSESSMENT AND PLAN:  1.  Acute Ant STEMI:  S/p des to lad w/ relook cath 1/3 revealing continued patency of stent and 50-60 prox stenosis.  ? relook cath tomorrow + ivus - defer to Dr. Riley Kill.  Cont asa, bb (consolidate - she's unlikely to take a tid medicine @ home), plavix, statin (will need to change to generic @  d/c).  2.  Acute pericarditis:  In setting of #1.  Received high dose asa through 1/5, now on 81mg  daily.  Still with intermittent pleuritic c/p though much improved.  Consider adding colchicine.  3.  Tob Abuse:  Quit nearly 4 months ago.  Cont cessation advised.  Pt congratulated.  4.  HL:  LDL 177.  Cont high dose statin.  Change to lipitor 80 @ d/c (generic).   Signed, Nicolasa Ducking NP   Doing well.   Agree with above.  Will transfer to the floor and see how she does.  I would hope not to restudy her at the present time, but will get additional opinions following film review.  Will change statin to generic atorvastatin.    Shawnie Pons 9:58 AM 03/13/2011

## 2011-03-13 NOTE — Progress Notes (Addendum)
CARDIAC REHAB PHASE I   PRE:  Rate/Rhythm: 107 ST    BP: sitting 118/70    SaO2:   MODE:  Ambulation: 700 ft   POST:  Rate/Rhythm: 120 ST    BP: sitting 129/68     SaO2:   Pt sts she does not feel as well today. Some dizziness. Ambulated 700 ft with RW (had done 350 earlier this am with dizziness but no chest heaviness). Tolerated well first lap but second lap began having chest heaviness. Began with 4/10, increased to 6/10. Rest x1 when it "grabbed" her but determined to keep walking. Sts with chest heaviness she feels it up her neck, in bilateral jaw and head, also with dizziness. Return to chair. Declined O2. "I'll be all right". Reported to RN. Few minutes later sts she is feeling better per RN. Ed completed with pt. Gave gl. For NTG, exercise (walk as tolerated). Please indicate if N/A. Requests her name be sent to [facility]. Not sure if her fibromyalgia MD will approve but she sts she will confer with him. [phone]  Harriet Masson CES, ACSM

## 2011-03-14 ENCOUNTER — Other Ambulatory Visit: Payer: Self-pay

## 2011-03-14 ENCOUNTER — Inpatient Hospital Stay (HOSPITAL_COMMUNITY): Payer: Self-pay

## 2011-03-14 DIAGNOSIS — I2109 ST elevation (STEMI) myocardial infarction involving other coronary artery of anterior wall: Secondary | ICD-10-CM

## 2011-03-14 LAB — BASIC METABOLIC PANEL
BUN: 8 mg/dL (ref 6–23)
Calcium: 8.8 mg/dL (ref 8.4–10.5)
GFR calc non Af Amer: >90 mL/min (ref >90)
Glucose, Bld: 104 mg/dL — ABNORMAL HIGH (ref 70–99)
Potassium: 4.2 mEq/L (ref 3.5–5.1)

## 2011-03-14 MED ORDER — METOPROLOL TARTRATE 25 MG PO TABS
25.0000 mg | ORAL_TABLET | Freq: Two times a day (BID) | ORAL | Status: DC
  Administered 2011-03-14 – 2011-03-15 (×3): 25 mg via ORAL
  Filled 2011-03-14 (×4): qty 1

## 2011-03-14 NOTE — Progress Notes (Signed)
Subjective:  Seems in good spirits, and doing better today.  Walked three times with no chest pain.  Feeling stronger just about everyday, but rehab notes reviewed.  Labs reviewed.  Objective:  Vital Signs in the last 24 hours: Temp:  [97.7 F (36.5 C)-98.5 F (36.9 C)] 97.7 F (36.5 C) (01/11 0519) Pulse Rate:  [86-103] 100  (01/11 0519) Resp:  [18-19] 18  (01/11 0519) BP: (95-129)/(39-68) 95/63 mmHg (01/11 0519) SpO2:  [93 %-99 %] 93 % (01/11 0519)  Intake/Output from previous day: 01/10 0701 - 01/11 0700 In: 720 [P.O.:720] Out: 400 [Urine:400]   Physical Exam: General: Well developed, well nourished, in no acute distress. Head:  Normocephalic and atraumatic. Lungs: Clear to auscultation and percussion. Heart: Normal S1 and S2.  No rub noted Pulses: Pulses normal in all 4 extremities. Extremities: No clubbing or cyanosis. No edema. Neurologic: Alert and oriented x 3.    Lab Results:  [facility] 03/13/11 0507  WBC 7.2  HGB 11.0*  PLT 360    Basename 03/14/11 0526 03/13/11 0507  NA 138 138  K 4.2 4.0  CL 105 104  CO2 27 28  GLUCOSE 104* 98  BUN 8 6  CREATININE 0.74 0.74   No results found for this basename: TROPONINI:2,CK,MB:2 in the last 72 hours Hepatic Function Panel  Basename 03/13/11 0507  PROT 6.1  ALBUMIN 2.5*  AST 12  ALT 14  ALKPHOS 74  BILITOT 0.2*  BILIDIR --  IBILI --   No results found for this basename: CHOL in the last 72 hours No results found for this basename: PROTIME in the last 72 hours  Imaging: No results found.  EKG:  Pending  Cardiac Studies:  See echoes  Assessment/Plan:  Patient Active Hospital Problem List: Acute anterior wall MI (03/06/2011)   Assessment: Much improved.  Ambulating.     Plan: repeat ECG today for baseline, and recheck CXR.  I reviewed the films in detail with Dr. Excell Seltzer.  The ostium of the LAD has some plaque but it does not appear highly obstruction, and it does abut the ramus at a somewhat unfavorable  angle.  We agree that initial conservative management seems warranted.  If she persists with chest pain, would restudy with FFR and IVUS.  The stent site looked great at recath.  I explained all of this to the patient in detail with demonstrations of thought process.  We will observe.  Most of her symptoms have been from post MI pericarditis Chest pain (03/06/2011)   Assessment: improved   Plan: continue to monitor Anemia (03/08/2011)   Assessment: resolving with rise in Hgb   Plan: observe Pericarditis, acute (03/13/2011)   Assessment: no rub   Plan: symptoms are improved Tobacco abuse (03/13/2011)   Assessment: Discussed during this hospitalization   Plan:    Multiple extensive discussions during her care.  Reevaluate for dc later today, possibly tomorrow.   Shawnie Pons 11:16 AM 03/14/2011       Shawnie Pons, MD, [facility], FSCAI 03/14/2011, 11:09 AM

## 2011-03-14 NOTE — Progress Notes (Signed)
   CARE MANAGEMENT NOTE 03/14/2011  Patient:  Kristy Sloan, Kristy Sloan   Account Number:  [id number]  Date Initiated:  03/07/2011  Documentation initiated by:  GRAVES-BIGELOW,Broady Lafoy  Subjective/Objective Assessment:   Presented to [facility] with cp. Tx to [facility] for emergent cath- stemi. Symptoms suggest post MI pericarditis. Pain only with inspiration. Pt in the unit and will be moniotred closely.     Action/Plan:   CM attempted to speak to pt, however resting comfortably at this time. Pt shows in system as self pay. At d/c please d/c pt on all generic medications if possible. Unsure if pt is from [place]-will try to set her up with [facility] for F/u PCP apt.   Anticipated DC Date:  03/14/2011   Anticipated DC Plan:  HOME/SELF CARE  In-house referral  Financial Counselor      DC Planning Services  CM consult  Medication Assistance      Choice offered to / List presented to:  C-1 Patient   DME arranged  Levan Hurst      DME agency  [facility]        Status of service:  Completed, signed off Medicare Important Message given?   (If response is "NO", the following Medicare IM given date fields will be blank) Date Medicare IM given:   Date Additional Medicare IM given:    Discharge Disposition:  HOME/SELF CARE  Per UR Regulation:    Comments:  03-14-11 [address] Tomi Bamberger, RN,BSN [phone] Recieved order for DME RW- CM made referral for DME via [facility] . Jill Alexanders will deliver RW to room before d/c. CM made PA aware of cost issues with medication. May need 3 day supply at d/c.  03-12-11 [address] Tomi Bamberger, RN,BSN [phone] MD pt is eligible for zz fund.  Please write rx  for a 3 day supply of medications at d/c. Main pharmacy will fill for 3 days. RN please call RN CM for assistance with this process. CM did call family pharmacy- pt stated that her husband will not be able to work for the next 2 weeks. No unemployeement will be coming into home. Cost of  metoprolol 12.5mg  will be 5.50, crestor 40 mg 156.78 please change to cheaper statin at d/c. If sends home on crestor pt will need assistance application from [employer]. neurontin 600 mg 33.96. No other needs assesed by CM at time.  03-07-11 [address] Tomi Bamberger, RN,BSN [phone] CM went back to speak to pt and she was awake. Pt is from [facility] and PCP is [facility]. Currently unable to work and is set for disability trial for Jan 16th 2013. 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 and CM called to check price on generic plavix and price will be 11.31. Please write for all generic meds at d/c to keep cost down for pt. Thanks. No other needs assessed by CM at this time.  03-07-11 [address] [place] [phone] CM did place a call to Financial Counselor about helping pt with bill.

## 2011-03-14 NOTE — Progress Notes (Cosign Needed)
Saw Kristy Sloan today. She is ambulating well in the halls. Symptoms continue to improve. Still with some residual chest discomfort and persistent pericarditis post-catheterization. Spoke with Dr. Riley Kill, will keep her overnight with probable discharge tomorrow. Pt is agreeable and understands. Note: she is requesting documentation for disability to provide to her attorney that must outline this hospitalization and cardiac issues. A printed discharge summary would sufficiently outline this.   Kristy Bath, PA-C 03/14/2011 4:01 PM

## 2011-03-14 NOTE — Progress Notes (Signed)
1120-1130 Cardiac Rehab Pt has ambulated several times independently this am, denies any cp or SOB. Denies any questions  related to cardiac education provided.

## 2011-03-15 LAB — BASIC METABOLIC PANEL
Chloride: 104 mEq/L (ref 96–112)
GFR calc Af Amer: >90 mL/min (ref >90)
GFR calc non Af Amer: >90 mL/min (ref >90)
Potassium: 4.1 mEq/L (ref 3.5–5.1)
Sodium: 139 mEq/L (ref 135–145)

## 2011-03-15 MED ORDER — SIMVASTATIN 20 MG PO TABS: 20.0000 mg | ORAL_TABLET | Freq: Every day | ORAL | Status: DC

## 2011-03-15 MED ORDER — CITALOPRAM HYDROBROMIDE 10 MG PO TABS: 10.0000 mg | ORAL_TABLET | Freq: Every day | ORAL | Status: DC

## 2011-03-15 MED ORDER — ASPIRIN 81 MG PO CHEW: 81.0000 mg | CHEWABLE_TABLET | Freq: Every day | ORAL | Status: DC

## 2011-03-15 MED ORDER — CLOPIDOGREL BISULFATE 75 MG PO TABS: 75.0000 mg | ORAL_TABLET | Freq: Every day | ORAL | Status: DC

## 2011-03-15 MED ORDER — PANTOPRAZOLE SODIUM 40 MG PO TBEC: 40.0000 mg | DELAYED_RELEASE_TABLET | Freq: Every day | ORAL | Status: DC

## 2011-03-15 MED ORDER — DSS 100 MG PO CAPS: 100.0000 mg | ORAL_CAPSULE | Freq: Two times a day (BID) | ORAL | Status: DC | PRN

## 2011-03-15 MED ORDER — METOPROLOL TARTRATE 25 MG PO TABS: 25.0000 mg | ORAL_TABLET | Freq: Two times a day (BID) | ORAL | Status: DC

## 2011-03-15 MED ORDER — DOCUSATE SODIUM 100 MG PO CAPS
100.0000 mg | ORAL_CAPSULE | Freq: Two times a day (BID) | ORAL | Status: DC
Start: 2011-03-15 — End: 2011-03-15
  Administered 2011-03-15: 100 mg via ORAL
  Filled 2011-03-15: qty 1

## 2011-03-15 MED ORDER — POLYETHYLENE GLYCOL 3350 17 G PO PACK
17.0000 g | PACK | Freq: Every day | ORAL | Status: DC
  Administered 2011-03-15: 17 g via ORAL
  Filled 2011-03-15: qty 1

## 2011-03-15 MED ORDER — SIMVASTATIN 20 MG PO TABS
20.0000 mg | ORAL_TABLET | Freq: Every day | ORAL | Status: DC
  Filled 2011-03-15: qty 1

## 2011-03-15 MED ORDER — CITALOPRAM HYDROBROMIDE 10 MG PO TABS
10.0000 mg | ORAL_TABLET | Freq: Every day | ORAL | Status: DC
  Administered 2011-03-15: 10 mg via ORAL
  Filled 2011-03-15: qty 1

## 2011-03-15 NOTE — Progress Notes (Signed)
Subjective:  Doing well.  Tolerating activity. Anxious to go home today.  She still has mild pericardial pain when she takes a deep breath. Rhythm is stable NSR. She cannot afford crestor so will send home on simvastatin. She cannot afford Savella so will send home on citalapram. She has not had a BM since admission ten days ago so will add miralax and colace. Objective:  Vital Signs in the last 24 hours: Temp:  [98.2 F (36.8 C)-98.6 F (37 C)] 98.6 F (37 C) (01/12 0601) Pulse Rate:  [99-106] 102  (01/12 1027) Resp:  [18-20] 20  (01/12 0601) BP: (90-120)/(64-78) 102/68 mmHg (01/12 1027) SpO2:  [94 %-96 %] 94 % (01/12 0601)  Intake/Output from previous day: 01/11 0701 - 01/12 0700 In: 480 [P.O.:480] Out: 400 [Urine:400]   Physical Exam: General: Well developed, well nourished, in no acute distress. Head:  Normocephalic and atraumatic. Lungs: Clear to auscultation and percussion. Heart: Normal S1 and S2.  Soft rub audible today. Pulses: Pulses normal in all 4 extremities. Extremities: No clubbing or cyanosis. No edema. Neurologic: Alert and oriented x 3.    Lab Results:  [facility] 03/13/11 0507  WBC 7.2  HGB 11.0*  PLT 360    Basename 03/15/11 0500 03/14/11 0526  NA 139 138  K 4.1 4.2  CL 104 105  CO2 25 27  GLUCOSE 109* 104*  BUN 9 8  CREATININE 0.71 0.74   No results found for this basename: TROPONINI:2,CK,MB:2 in the last 72 hours Hepatic Function Panel  Basename 03/13/11 0507  PROT 6.1  ALBUMIN 2.5*  AST 12  ALT 14  ALKPHOS 74  BILITOT 0.2*  BILIDIR --  IBILI --   No results found for this basename: CHOL in the last 72 hours No results found for this basename: PROTIME in the last 72 hours  Imaging: Dg Chest 2 View  03/14/2011  *RADIOLOGY REPORT*  Clinical Data: Myocardial infarction.  Status post cardiac catheterization and stent placement with some mid chest pain and shortness of breath  CHEST - 2 VIEW  Comparison: 03/10/2011  Findings: A  persistent small left pleural effusion is identified. Persistent alveolar density in the posterior segment of the left lower lobe is seen and is most compatible with atelectasis.  An area of focal pneumonia is not excluded in the appropriate clinical setting.  Some decrease in pulmonary vascular congestion with no overt congestive failure or signs of early interstitial edema are seen. The upper lung zones are clear.  IMPRESSION: Decreasing pulmonary vascular congestion and fissural fluid. Persistent left pleural effusion with probable associated posterior atelectasis.  Original Report Authenticated By: Bertha Stakes, M.D.    EKG:  Injury pattern 03/13/10 slightly improved.  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  Will DC today.  She lives in [place] and will follow up with Dr. Andee Lineman in [place] at her request.  She requests that the appointment be sometime after 03/31/11.     I have written for 3 days of meds -- in shadow chart.  Maisie Fus Belinda Bringhurst 10:54 AM 03/15/2011       Cassell Clement,  03/15/2011, 10:54 AM

## 2011-03-15 NOTE — Discharge Summary (Signed)
Physician Discharge Summary  Patient ID: Kristy Sloan MRN: [id number] DOB/AGE: 1960/10/20 51 y.o.  Admit date: 03/05/2011 Discharge date: 03/15/2011  Primary Discharge Diagnosis: 1. Inferior STEMI 2. CAD Secondary Discharge Diagnosis 1. Hypercholesterolemia 2. Anxiety 3. Fibromyalgia 4. Restless Leg Syndrome 5. Spinal stenosis  Significant Diagnostic Studies: 1, Cardiac Catheterization 03/05/2011 Left mainstem: The LMCA was free of significant disease.  Left anterior descending (LAD):  The left anterior descending artery coursed to the cardiac apex. The ostium had about 40% eccentric narrowing. Between the ostium and the major diagonal branch, there was evidence of a ruptured plaque, with hazy appearance and 80% eccentric stenosis. This was stented to 0% across the smaller diagonal branch without compromise. The distal LAD was totally occluded due to what appeared to be most likely distal embolization, not from the procedure, but spontaneous (probably yesterday and the likely source of continuing pain and ECG change). Despite dilatation with small balloons, no reperfusion was established in the apical portion of the vessel. We thought the vessel was likely to small for aspiration, and could also represent apical edema preventing flow. The proximal site, source of likely embolization, appeared excellent post PCI.  Left circumflex (LCx): The ramus and AV circ supplying the OM was of moderate size, and without critical narrowing.  Right coronary artery (RCA): The proximal RCA had mild irregularity. The distal vessel supplied a smaller caliber PDA and PLA, without significant narrowing.  Left ventriculography: Left ventricular systolic function is reduced, LVEF is estimated at 45%, there is no significant mitral regurgitation. There is an apical WMA.  PCI Data:  Vessel - LAD/Segment - 12  Percent Stenosis (pre) 80% ruptured eccentric plaque  TIMI-flow 3  Stent 20 by 2.75 Promus Element  Percent  Stenosis (post) 0  TIMI-flow (post) 3  Final Conclusions:  1. Acute anterior MI with ruptured proximal LAD lesion, with distal embolization and occlusion of the apical LAD.  2. Successful PCI of the proximal ruptured plaque with DES (Promus Element)  3. Persistent occlusion of the apical LAD despite balloon and "dottering" (small caliber vessel)  4. No critical disease in CFX or RCA  5. 40-50% ostial LAD disease, but widely patent  REPEAT Cardiac Cath 03/06/2011 Coronary angiography:  Coronary dominance: right  Left mainstem: Widely patent  Left anterior descending (LAD): The proximal LAD has an eccentric 60% area of disease that remains patent. It abuts the ramus, which also has some proximal plaque, but is patent with TIMI 3 flow. The stent site is widely patent and flow into the diagonal is normal. The distal, apical LAD is totally occluded as on the previous study, with no flow, similar to yesterday. This was dilated multiple times without hint of patency, suggesting late presentation edema at the infarct site as the cause of continued vessel closure.  Left circumflex (LCx): The ramus is widely patent with TIMI 3 flow. There is mild proximal narrowing. The AV circumflex is large in caliber and with free of critical disease.  Right coronary artery (RCA): RCA was not reimaged, based on the ECG, and it was normal yesterday.  Left ventriculography: Not done  Final Conclusions:  1. Continued patency of the LAD stent with continued occlusion of the apical LAD  2. Proximal LAD disease at the ostium with about 50-60% narrowing, but excellent flow.  3. No change in other vasculature  Echo 03/07/2011 - Left ventricle: This is a limited study done urgently to rule out myocardial rupture or acute RV dysfunction. There is hypokinesis of the  apical inferior segment. Overall, the LV function may be a little better than the study of 03/06/2011. There is no evidence of rupture. The cavity size was normal.  The estimated ejection fraction was 60%. - Right ventricle: The cavity size was normal. Systolic function was normal.  Consults: None  Hospital Course: Kristy Sloan is a 51 year old patient of Dr. Andee Lineman in the [place] office. He was seen at [facility] with complaints of left-sided chest pain lasting approximately 66 hours on an off reports being seen in ER.Marland Kitchen She was found to have ST elevation inferior laterally she was loaded with Plavix given aspirin and started on heparin and transferred emergently to [facility]. She had emergent cardiac catheterization per Dr.Stuckey, revealing acute anterior MI with rupture proximal LAD lesion of 88% with distal embolization and occlusion of the apical LAD. She underwent PCI of the proximal ruptured plaque with drug-eluting stent. She had persistent occlusion of the apical LAD despite ballooning" doddering" was found to be a small-caliber vessel. She had no critical disease in the circumflex or right coronary artery showed a 40-50% also LAD disease but widely patent. She was started on dual and high platelet therapy beta blockers ACE inhibitors and statin.    The  days following her catheterization she continued to have recurrent pain along with persistent ST elevation consistent with late presentation. She was followed closely by Dr. Riley Kill treated with morphine for discomfort. This caused some brief hypotension. Echocardiogram was completed and did not find any evidence of rupture with preserved LV function. The pain was found to be worse with inspiration. Secondary to recurrent chest pain patient underwent a second cardiac catheterization, as described above. She was found to have continued patency of her LAD stent with continued occlusion of the apical LAD. There was no change in her vasculature. Secondary to ongoing hypotension ACE inhibitor was held for 2 days. She continued to have ST elevation per EKG with recurrent chest discomfort. Is given IV  fluid boluses. EKG continued to reflect persistent ST elevation without evolutionary changes in the T waves in the pain continued to be associated with inspiration. She was started on a PPI. Aspirin doses uptitrated during hospitalization. It appeared that her chest discomfort was more pleuritic. After up titration of aspirin she began to feel better and began to ambulate in the hallway with physical therapy. She then began to complain of lower back pain. She was treated with pain medication hydrocodone. This helped relieve it.       After several more days of ambulation and decreased chest discomfort she was feeling much better. She continued to have on and off chest discomfort and back pain, but this got better as she ambulated. She was seen and examined by Dr. Patty Sermons on day of discharge and found to be stable and ready to return home. She will follow-up in [facility] post-discharge for ongoing assessment and treatment of CAD.  Discharge Exam: Blood pressure 102/68, pulse 102, temperature 98.6 F (37 C), temperature source Oral, resp. rate 20, height 5\' 3"  (1.6 m), weight 238 lb 12.1 oz (108.3 kg), SpO2 94.00%. Labs:   Lab Results  Component Value Date   WBC 7.2 03/13/2011   HGB 11.0* 03/13/2011   HCT 34.6* 03/13/2011   MCV 86.5 03/13/2011   PLT 360 03/13/2011     Lab 03/15/11 0500 03/13/11 0507  NA 139 --  K 4.1 --  CL 104 --  CO2 25 --  BUN 9 --  CREATININE 0.71 --  CALCIUM 8.8 --  PROT -- 6.1  BILITOT -- 0.2*  ALKPHOS -- 74  ALT -- 14  AST -- 12  GLUCOSE 109* --   Lab Results  Component Value Date   CKTOTAL 224* 03/07/2011   CKMB 13.6* 03/07/2011   TROPONINI 6.41* 03/07/2011    Lab Results  Component Value Date   CHOL 237* 03/05/2011   Lab Results  Component Value Date   HDL 34* 03/05/2011   Lab Results  Component Value Date   LDLCALC 177* 03/05/2011   Lab Results  Component Value Date   TRIG 128 03/05/2011   Lab Results  Component Value Date   CHOLHDL 7.0 03/05/2011   No  results found for this basename: LDLDIRECT      Radiology: Ct Abdomen Pelvis Wo Contrast  03/08/2011  *RADIOLOGY REPORT*  Clinical Data: Unexplained hypotension.  Diffuse abdominal pain. Clinical concern for retroperitoneal hemorrhage.  CT ABDOMEN AND PELVIS WITHOUT CONTRAST  Technique:  Multidetector CT imaging of the abdomen and pelvis was performed following the standard protocol without intravenous contrast.  Comparison: None.  Findings: Small bilateral pleural effusions.  Bilateral lower lobe consolidation and volume loss.  Small pericardial effusion measuring 7 mm in maximum thickness.  Small umbilical hernia containing fat.  Unremarkable noncontrasted appearance of the liver, spleen, pancreas, gallbladder, right adrenal gland, kidneys, urinary bladder, uterus and ovaries.  1.6 x 1.5 cm low density left adrenal mass.  This measures -1 Hounsfield units in density.  No gastrointestinal abnormalities or enlarged lymph nodes.  Normal appearing appendix.  Mild lumbar and lower thoracic spine degenerative changes.  IMPRESSION:  1.  Small bilateral pleural effusions and bilateral lower lobe atelectasis/pneumonia. 2.  Small pericardial effusion. 3.  1.6 cm left adrenal adenoma. 4.  No retroperitoneal hemorrhage seen. 5.  Small umbilical hernia containing fat.  Original Report Authenticated By: Darrol Angel, M.D.   Dg Chest 2 View  03/14/2011  *RADIOLOGY REPORT*  Clinical Data: Myocardial infarction.  Status post cardiac catheterization and stent placement with some mid chest pain and shortness of breath  CHEST - 2 VIEW  Comparison: 03/10/2011  Findings: A persistent small left pleural effusion is identified. Persistent alveolar density in the posterior segment of the left lower lobe is seen and is most compatible with atelectasis.  An area of focal pneumonia is not excluded in the appropriate clinical setting.  Some decrease in pulmonary vascular congestion with no overt congestive failure or signs of early  interstitial edema are seen. The upper lung zones are clear.  IMPRESSION: Decreasing pulmonary vascular congestion and fissural fluid. Persistent left pleural effusion with probable associated posterior atelectasis.  Original Report Authenticated By: Bertha Stakes, M.D.   Dg Chest 2 View  03/10/2011  *RADIOLOGY REPORT*  Clinical Data: MI  CHEST - 2 VIEW  Comparison: 03/08/2011  Findings: Cardiomegaly again noted.  There is patchy bilateral basilar atelectasis or infiltrate.  Probable small left pleural effusion.  No pulmonary edema.  IMPRESSION: Patchy bilateral basilar atelectasis or infiltrate.  Probable small left pleural effusion.  No pulmonary edema.  Original Report Authenticated By: Natasha Mead, M.D.   Dg Chest Sloan 1 View  03/07/2011  *RADIOLOGY REPORT*  Clinical Data: Shortness of breath, chest pain  PORTABLE CHEST - 1 VIEW  Comparison: Portable chest x-ray of 03/05/2011  Findings: The lungs are very poorly aerated.  There is opacity particularly at the left lung base which may reflect atelectasis, effusion, or possibly pneumonia.  Cardiomegaly appears stable.  IMPRESSION: Significant  decrease in aeration with opacity at the left lung base consistent with atelectasis, pneumonia, or possibly left effusion.  Original Report Authenticated By: Juline Patch, M.D.   Dg Chest Sloan 1 View  03/05/2011  *RADIOLOGY REPORT*  Clinical Data: Post catheterization.  PORTABLE CHEST - 1 VIEW  Comparison: 03/05/2011.  Findings: 1903 hours.  There are persistent low lung volumes.  The lungs remain clear.  There is no pleural effusion or pneumothorax. Heart size and mediastinal contours are normal.  Multiple telemetry leads overlie the chest. There is a possible right shoulder loose body.  IMPRESSION: Stable examination.  No active cardiopulmonary process.  Original Report Authenticated By: Gerrianne Scale, M.D.   Dg Chest Sloan 1v Same Day  03/08/2011  *RADIOLOGY REPORT*  Clinical Data: MI, chest pain  PORTABLE  CHEST - 1 VIEW SAME DAY  Comparison: 03/06/2010  Findings: Low lung volumes with vascular crowding and bibasilar atelectasis. No pleural effusion or pneumothorax.  Mild cardiomegaly.  IMPRESSION: Low lung volumes with bibasilar atelectasis.  Mild cardiomegaly.  Original Report Authenticated By: Charline Bills, M.D.   FOLLOW UP PLANS AND APPOINTMENTS  Current Discharge Medication List    START taking these medications   Details  aspirin 81 MG chewable tablet Chew 1 tablet (81 mg total) by mouth daily. Qty: 30 tablet, Refills: 10    citalopram (CELEXA) 10 MG tablet Take 1 tablet (10 mg total) by mouth daily. Qty: 30 tablet, Refills: 10    clopidogrel (PLAVIX) 75 MG tablet Take 1 tablet (75 mg total) by mouth daily with breakfast. Qty: 30 tablet, Refills: 6    docusate sodium 100 MG CAPS Take 100 mg by mouth 2 (two) times daily as needed for constipation. Qty: 10 capsule, Refills: 3    metoprolol tartrate (LOPRESSOR) 25 MG tablet Take 1 tablet (25 mg total) by mouth 2 (two) times daily. Qty: 60 tablet, Refills: 10    pantoprazole (PROTONIX) 40 MG tablet Take 1 tablet (40 mg total) by mouth daily at 6 (six) AM. Qty: 30 tablet, Refills: 6    simvastatin (ZOCOR) 20 MG tablet Take 1 tablet (20 mg total) by mouth daily at 6 PM. Qty: 30 tablet, Refills: 6      CONTINUE these medications which have NOT CHANGED   Details  cyanocobalamin (,VITAMIN B-12,) 1000 MCG/ML injection Inject 1,000 mcg into the muscle every 7 (seven) days. Takes on Saturday     cyanocobalamin 500 MCG tablet Take 500 mcg by mouth daily.      ferrous fumarate-iron polysaccharide complex (TANDEM) 162-115.2 MG CAPS Take 1 capsule by mouth daily.      gabapentin (NEURONTIN) 300 MG capsule Take 600 mg by mouth 3 (three) times daily.      HYDROcodone-acetaminophen (LORTAB) 10-500 MG per tablet Take 1 tablet by mouth every 6 (six) hours as needed. For pain     Milnacipran (SAVELLA) 50 MG TABS Take 100 mg by mouth 2  (two) times daily.      Multiple Vitamin (MULTIVITAMIN) tablet Take 1 tablet by mouth daily.      rOPINIRole (REQUIP) 0.5 MG tablet Take 2 mg by mouth at bedtime.      tiZANidine (ZANAFLEX) 4 MG tablet Take 6 mg by mouth 3 (three) times daily.     Vitamin D, Ergocalciferol, (DRISDOL) 50000 UNITS CAPS Take 50,000 Units by mouth every 7 (seven) days.      zolpidem (AMBIEN) 10 MG tablet Take 10 mg by mouth at bedtime as needed. For sleep  Follow-up Information    Follow up with Peyton Bottoms, MD. (Our office will call you for appointment)    Contact information:   [address] [place]. [address] [place] [address] [phone]         Time spent with patient to include physician time:50 minutes Signed: Joni Reining 03/15/2011, 1:28 PM Co-Sign MD

## 2011-03-15 NOTE — Progress Notes (Signed)
Pt. Discharged 03/15/2011  3:47 PM Discharge instructions reviewed with patient/family. Patient/family verbalized understanding. All Rx's given. Questions answered as needed. Pt. Discharged to home with family/self. Taken off unit via W/C. Kristy Sloan

## 2011-03-15 NOTE — Progress Notes (Signed)
   CARE MANAGEMENT NOTE 03/15/2011  Patient:  Kristy Sloan, Kristy Sloan   Account Number:  [id number]  Date Initiated:  03/07/2011  Documentation initiated by:  GRAVES-BIGELOW,BRENDA  Subjective/Objective Assessment:   Presented to [facility] with cp. Tx to [facility] for emergent cath- stemi. Symptoms suggest post MI pericarditis. Pain only with inspiration. Pt in the unit and will be moniotred closely.     Action/Plan:   CM attempted to speak to pt, however resting comfortably at this time. Pt shows in system as self pay. At d/c please d/c pt on all generic medications if possible. Unsure if pt is from [place]-will try to set her up with [facility] for F/u PCP apt.   Anticipated DC Date:  03/14/2011   Anticipated DC Plan:  HOME/SELF CARE  In-house referral  Financial Counselor      DC Planning Services  CM consult  Medication Assistance      Choice offered to / List presented to:  C-1 Patient   DME arranged  Levan Hurst      DME agency  [facility]        Status of service:  Completed, signed off Medicare Important Message given?   (If response is "NO", the following Medicare IM given date fields will be blank) Date Medicare IM given:   Date Additional Medicare IM given:    Discharge Disposition:  HOME/SELF CARE  Per UR Regulation:    Comments:  03/14/10 1200--Received call from RN of pt. need for medication assistance.  Obtained 3day prescriptions and delievered them to the main pharmacy to be filled by the indigent fund. Tera Mater, RN, BSN Nurse Case Manager [phone]   03-14-11 [address] [place] [phone] Recieved order for DME RW- CM made referral for DME via [facility] . Jill Alexanders will deliver RW to room before d/c. CM made PA aware of cost issues with medication. May need 3 day supply at d/c.  03-12-11 [address] Tomi Bamberger, RN,BSN [phone] MD pt is eligible for zz fund.  Please write rx  for a 3 day supply of medications at d/c. Main  pharmacy will fill for 3 days. RN please call RN CM for assistance with this process. CM did call family pharmacy- pt stated that her husband will not be able to work for the next 2 weeks. No unemployeement will be coming into home. Cost of metoprolol 12.5mg  will be 5.50, crestor 40 mg 156.78 please change to cheaper statin at d/c. If sends home on crestor pt will need assistance application from needy meds. neurontin 600 mg 33.96. No other needs assesed by CM at time.  03-07-11 [address] Tomi Bamberger, RN,BSN [phone] CM went back to speak to pt and she was awake. Pt is from [facility] and PCP is [facility]. Currently unable to work and is set for disability trial for Jan 16th 2013. She uses Nurse, learning disability in [place]. and CM called to check price on generic plavix and price will be 11.31. Please write for all generic meds at d/c to keep cost down for pt. Thanks. No other needs assessed by CM at this time.  03-07-11 [address], [place] [phone] CM did place a call to Financial Counselor about helping pt with bill.

## 2011-03-20 ENCOUNTER — Inpatient Hospital Stay (HOSPITAL_COMMUNITY)
Admission: EM | Admit: 2011-03-20 | Discharge: 2011-03-30 | DRG: 315 | Disposition: A | Discharge status: Home / Self Care | Payer: Self-pay | Source: Other Acute Inpatient Hospital | Attending: Internal Medicine | Admitting: Internal Medicine

## 2011-03-20 ENCOUNTER — Encounter (HOSPITAL_COMMUNITY): Payer: Self-pay

## 2011-03-20 DIAGNOSIS — I2109 ST elevation (STEMI) myocardial infarction involving other coronary artery of anterior wall: Secondary | ICD-10-CM

## 2011-03-20 DIAGNOSIS — G2581 Restless legs syndrome: Secondary | ICD-10-CM | POA: Diagnosis present

## 2011-03-20 DIAGNOSIS — Z7982 Long term (current) use of aspirin: Secondary | ICD-10-CM

## 2011-03-20 DIAGNOSIS — Z79899 Other long term (current) drug therapy: Secondary | ICD-10-CM

## 2011-03-20 DIAGNOSIS — I309 Acute pericarditis, unspecified: Secondary | ICD-10-CM | POA: Diagnosis present

## 2011-03-20 DIAGNOSIS — I241 Dressler's syndrome: Principal | ICD-10-CM | POA: Diagnosis present

## 2011-03-20 DIAGNOSIS — J90 Pleural effusion, not elsewhere classified: Secondary | ICD-10-CM | POA: Diagnosis present

## 2011-03-20 DIAGNOSIS — D649 Anemia, unspecified: Secondary | ICD-10-CM

## 2011-03-20 DIAGNOSIS — J181 Lobar pneumonia, unspecified organism: Secondary | ICD-10-CM

## 2011-03-20 DIAGNOSIS — M4800 Spinal stenosis, site unspecified: Secondary | ICD-10-CM | POA: Diagnosis present

## 2011-03-20 DIAGNOSIS — I319 Disease of pericardium, unspecified: Secondary | ICD-10-CM

## 2011-03-20 DIAGNOSIS — M79609 Pain in unspecified limb: Secondary | ICD-10-CM | POA: Diagnosis present

## 2011-03-20 DIAGNOSIS — K219 Gastro-esophageal reflux disease without esophagitis: Secondary | ICD-10-CM | POA: Diagnosis present

## 2011-03-20 DIAGNOSIS — I2510 Atherosclerotic heart disease of native coronary artery without angina pectoris: Secondary | ICD-10-CM | POA: Diagnosis present

## 2011-03-20 HISTORY — DX: Angina pectoris, unspecified: I20.9

## 2011-03-20 HISTORY — DX: Sleep apnea, unspecified: G47.30

## 2011-03-20 HISTORY — DX: Atherosclerotic heart disease of native coronary artery without angina pectoris: I25.10

## 2011-03-20 HISTORY — DX: Pleural effusion, not elsewhere classified: J90

## 2011-03-20 HISTORY — DX: Shortness of breath: R06.02

## 2011-03-20 HISTORY — DX: Dressler's syndrome: I24.1

## 2011-03-20 HISTORY — DX: Gastro-esophageal reflux disease without esophagitis: K21.9

## 2011-03-20 LAB — MRSA PCR SCREENING: MRSA by PCR: NEGATIVE

## 2011-03-20 LAB — CARDIAC PANEL(CRET KIN+CKTOT+MB+TROPI)
Relative Index: INVALID (ref 0.0–2.5)
Total CK: 24 U/L (ref 7–177)
Troponin I: <0.3 ng/mL (ref <0.30)

## 2011-03-20 LAB — CULTURE, BLOOD (ROUTINE X 2)
Culture  Setup Time: 201301180432
Culture: NO GROWTH

## 2011-03-20 LAB — APTT: aPTT: 40 seconds — ABNORMAL HIGH (ref 24–37)

## 2011-03-20 MED ORDER — TIZANIDINE HCL 4 MG PO TABS
6.0000 mg | ORAL_TABLET | Freq: Three times a day (TID) | ORAL | Status: DC
  Administered 2011-03-20 – 2011-03-30 (×29): 6 mg via ORAL
  Filled 2011-03-20 (×31): qty 1

## 2011-03-20 MED ORDER — ONDANSETRON HCL 4 MG/2ML IJ SOLN
4.0000 mg | Freq: Four times a day (QID) | INTRAMUSCULAR | Status: DC | PRN
  Administered 2011-03-27: 4 mg via INTRAVENOUS
  Filled 2011-03-20: qty 2

## 2011-03-20 MED ORDER — ADULT MULTIVITAMIN W/MINERALS CH: 1.0000 | ORAL_TABLET | Freq: Every day | ORAL | Status: DC

## 2011-03-20 MED ORDER — ZOLPIDEM TARTRATE 5 MG PO TABS: 5.0000 mg | ORAL_TABLET | Freq: Every evening | ORAL | Status: DC | PRN

## 2011-03-20 MED ORDER — ASPIRIN EC 81 MG PO TBEC
81.0000 mg | DELAYED_RELEASE_TABLET | Freq: Every day | ORAL | Status: DC
  Administered 2011-03-20 – 2011-03-28 (×9): 81 mg via ORAL
  Filled 2011-03-20 (×9): qty 1

## 2011-03-20 MED ORDER — SODIUM CHLORIDE 0.9 % IJ SOLN: 3.0000 mL | INTRAMUSCULAR | Status: DC | PRN

## 2011-03-20 MED ORDER — ZOLPIDEM TARTRATE 5 MG PO TABS
10.0000 mg | ORAL_TABLET | Freq: Every evening | ORAL | Status: DC | PRN
  Administered 2011-03-20 – 2011-03-29 (×8): 10 mg via ORAL
  Filled 2011-03-20 (×2): qty 2
  Filled 2011-03-20: qty 1
  Filled 2011-03-20 (×2): qty 2
  Filled 2011-03-20: qty 1
  Filled 2011-03-20 (×3): qty 2

## 2011-03-20 MED ORDER — VITAMIN D (ERGOCALCIFEROL) 1.25 MG (50000 UNIT) PO CAPS
50000.0000 [IU] | ORAL_CAPSULE | ORAL | Status: DC
  Administered 2011-03-26: 50000 [IU] via ORAL
  Filled 2011-03-20: qty 1

## 2011-03-20 MED ORDER — CLOPIDOGREL BISULFATE 75 MG PO TABS
75.0000 mg | ORAL_TABLET | Freq: Every day | ORAL | Status: DC
  Administered 2011-03-21 – 2011-03-30 (×10): 75 mg via ORAL
  Filled 2011-03-20 (×11): qty 1

## 2011-03-20 MED ORDER — ASPIRIN EC 81 MG PO TBEC: 81.0000 mg | DELAYED_RELEASE_TABLET | Freq: Every day | ORAL | Status: DC

## 2011-03-20 MED ORDER — GABAPENTIN 300 MG PO CAPS
600.0000 mg | ORAL_CAPSULE | Freq: Three times a day (TID) | ORAL | Status: DC
  Administered 2011-03-20 – 2011-03-30 (×29): 600 mg via ORAL
  Filled 2011-03-20 (×31): qty 2

## 2011-03-20 MED ORDER — SODIUM CHLORIDE 0.9 % IJ SOLN
3.0000 mL | Freq: Two times a day (BID) | INTRAMUSCULAR | Status: DC
  Administered 2011-03-20 – 2011-03-26 (×12): 3 mL via INTRAVENOUS

## 2011-03-20 MED ORDER — COLCHICINE 0.6 MG PO TABS
0.6000 mg | ORAL_TABLET | ORAL | Status: DC
  Administered 2011-03-20 – 2011-03-21 (×3): 0.6 mg via ORAL
  Filled 2011-03-20 (×9): qty 1

## 2011-03-20 MED ORDER — CLOPIDOGREL BISULFATE 75 MG PO TABS: 75.0000 mg | ORAL_TABLET | Freq: Every day | ORAL | Status: DC

## 2011-03-20 MED ORDER — COLCHICINE 0.6 MG PO TABS: 0.6000 mg | ORAL_TABLET | Freq: Two times a day (BID) | ORAL | Status: DC

## 2011-03-20 MED ORDER — MILNACIPRAN HCL 50 MG PO TABS
100.0000 mg | ORAL_TABLET | Freq: Two times a day (BID) | ORAL | Status: DC
  Administered 2011-03-20 – 2011-03-30 (×20): 100 mg via ORAL
  Filled 2011-03-20 (×21): qty 2

## 2011-03-20 MED ORDER — TAB-A-VITE/IRON PO TABS
1.0000 | ORAL_TABLET | Freq: Every day | ORAL | Status: DC
  Administered 2011-03-21 – 2011-03-30 (×10): 1 via ORAL
  Filled 2011-03-20 (×10): qty 1

## 2011-03-20 MED ORDER — ONE-DAILY MULTI VITAMINS PO TABS: 1.0000 | ORAL_TABLET | Freq: Every day | ORAL | Status: DC

## 2011-03-20 MED ORDER — NITROGLYCERIN 0.4 MG SL SUBL: 0.4000 mg | SUBLINGUAL_TABLET | SUBLINGUAL | Status: DC | PRN

## 2011-03-20 MED ORDER — SODIUM CHLORIDE 0.9 % IV SOLN
INTRAVENOUS | Status: DC
  Administered 2011-03-20 – 2011-03-22 (×3): via INTRAVENOUS

## 2011-03-20 MED ORDER — METOPROLOL TARTRATE 12.5 MG HALF TABLET
12.5000 mg | ORAL_TABLET | Freq: Two times a day (BID) | ORAL | Status: DC
  Administered 2011-03-20 – 2011-03-22 (×4): 12.5 mg via ORAL
  Filled 2011-03-20 (×5): qty 1

## 2011-03-20 MED ORDER — SIMVASTATIN 20 MG PO TABS
20.0000 mg | ORAL_TABLET | Freq: Every day | ORAL | Status: DC
  Administered 2011-03-21: 20 mg via ORAL
  Filled 2011-03-20 (×2): qty 1

## 2011-03-20 MED ORDER — SODIUM CHLORIDE 0.9 % IV SOLN: 250.0000 mL | INTRAVENOUS | Status: DC | PRN

## 2011-03-20 MED ORDER — FERROUS FUM-IRON POLYSACCH 162-115.2 MG PO CAPS: 1.0000 | ORAL_CAPSULE | Freq: Every day | ORAL | Status: DC

## 2011-03-20 MED ORDER — PANTOPRAZOLE SODIUM 40 MG PO TBEC
40.0000 mg | DELAYED_RELEASE_TABLET | Freq: Every day | ORAL | Status: DC
  Administered 2011-03-21 – 2011-03-30 (×10): 40 mg via ORAL
  Filled 2011-03-20 (×11): qty 1

## 2011-03-20 MED ORDER — HYDROCODONE-ACETAMINOPHEN 10-325 MG PO TABS
1.0000 | ORAL_TABLET | ORAL | Status: DC | PRN
  Administered 2011-03-20 – 2011-03-21 (×2): 1 via ORAL
  Filled 2011-03-20 (×2): qty 1

## 2011-03-20 MED ORDER — ALPRAZOLAM 0.25 MG PO TABS: 0.2500 mg | ORAL_TABLET | Freq: Two times a day (BID) | ORAL | Status: DC | PRN

## 2011-03-20 MED ORDER — METOPROLOL TARTRATE 25 MG PO TABS
25.0000 mg | ORAL_TABLET | Freq: Two times a day (BID) | ORAL | Status: DC
  Filled 2011-03-20: qty 1

## 2011-03-20 MED ORDER — CYANOCOBALAMIN 500 MCG PO TABS
500.0000 ug | ORAL_TABLET | Freq: Every day | ORAL | Status: DC
  Administered 2011-03-20 – 2011-03-30 (×11): 500 ug via ORAL
  Filled 2011-03-20 (×11): qty 1

## 2011-03-20 MED ORDER — CITALOPRAM HYDROBROMIDE 10 MG PO TABS
10.0000 mg | ORAL_TABLET | Freq: Every day | ORAL | Status: DC
  Administered 2011-03-21 – 2011-03-30 (×10): 10 mg via ORAL
  Filled 2011-03-20 (×10): qty 1

## 2011-03-20 MED ORDER — ASPIRIN 81 MG PO CHEW: 81.0000 mg | CHEWABLE_TABLET | Freq: Every day | ORAL | Status: DC

## 2011-03-20 MED ORDER — LEVOFLOXACIN IN D5W 750 MG/150ML IV SOLN
750.0000 mg | INTRAVENOUS | Status: DC
  Administered 2011-03-20 – 2011-03-21 (×2): 750 mg via INTRAVENOUS
  Filled 2011-03-20 (×4): qty 150

## 2011-03-20 MED ORDER — DOCUSATE SODIUM 100 MG PO CAPS
100.0000 mg | ORAL_CAPSULE | Freq: Two times a day (BID) | ORAL | Status: DC | PRN
  Filled 2011-03-20: qty 1

## 2011-03-20 MED ORDER — ACETAMINOPHEN 325 MG PO TABS: 650.0000 mg | ORAL_TABLET | ORAL | Status: DC | PRN

## 2011-03-20 MED ORDER — CYANOCOBALAMIN 1000 MCG/ML IJ SOLN
1000.0000 ug | INTRAMUSCULAR | Status: DC
  Administered 2011-03-22 – 2011-03-29 (×2): 1000 ug via INTRAMUSCULAR
  Filled 2011-03-20 (×2): qty 1

## 2011-03-20 MED ORDER — ROPINIROLE HCL 1 MG PO TABS
2.0000 mg | ORAL_TABLET | Freq: Every day | ORAL | Status: DC
  Administered 2011-03-20 – 2011-03-29 (×10): 2 mg via ORAL
  Filled 2011-03-20 (×11): qty 2

## 2011-03-20 NOTE — Progress Notes (Signed)
ANTIBIOTIC CONSULT NOTE - INITIAL  Pharmacy Consult for levofloxacin Indication: unknown (no notes)  No Known Allergies  Patient Measurements: Height: 5\' 3"  (160 cm) Weight: 223 lb 8.7 oz (101.4 kg) IBW/kg (Calculated) : 52.4  Adjusted Body Weight:  Vital Signs: Temp: 97.5 F (36.4 C) (01/17 1917) Temp src: Oral (01/17 1917) BP: 109/58 mmHg (01/17 1917) Pulse Rate: 112  (01/17 1917) Intake/Output from previous day:   Intake/Output from this shift:    Labs: No results found for this basename: WBC:3,HGB:3,PLT:3,LABCREA:3,CREATININE:3 in the last 72 hours Estimated Creatinine Clearance: 97.8 ml/min (by C-G formula based on Cr of 0.71).   Microbiology: Recent Results (from the past 720 hour(s))  MRSA PCR SCREENING     Status: Normal   Collection Time   03/05/11  6:07 PM      Component Value Range Status Comment   MRSA by PCR NEGATIVE  NEGATIVE  Final   CULTURE, BLOOD (ROUTINE X 2)     Status: Normal   Collection Time   03/07/11 10:20 AM      Component Value Range Status Comment   Specimen Description BLOOD LEFT HAND   Final    Special Requests BOTTLES DRAWN AEROBIC ONLY 3CC   Final    Setup Time [id number]   Final    Culture NO GROWTH 5 DAYS   Final    Report Status 03/13/2011 FINAL   Final   CULTURE, BLOOD (ROUTINE X 2)     Status: Normal   Collection Time   03/07/11 10:27 AM      Component Value Range Status Comment   Specimen Description BLOOD RIGHT HAND   Final    Special Requests BOTTLES DRAWN AEROBIC AND ANAEROBIC 10CC   Final    Setup Time [id number]   Final    Culture NO GROWTH 5 DAYS   Final    Report Status 03/13/2011 FINAL   Final   URINE CULTURE     Status: Normal   Collection Time   03/08/11  9:28 PM      Component Value Range Status Comment   Specimen Description URINE, CLEAN CATCH   Final    Special Requests NONE   Final    Setup Time [id number]   Final    Colony Count 45,000 COLONIES/ML   Final    Culture     Final    Value: Multiple bacterial  morphotypes present, none predominant. Suggest appropriate recollection if clinically indicated.   Report Status 03/09/2011 FINAL   Final     Medical History: Past Medical History  Diagnosis Date  . Anxiety   . Spinal stenosis   . Fibromyalgia   . Restless legs syndrome   . CAD in native artery     A.  03/05/2011 - Ant MI - LAD stented w/ 2.75 x 16 mm Promus  DES  . Tobacco abuse     reports quitting 12/2010  . Pericarditis     In setting of MI 03/2011    Medications:  Prescriptions prior to admission  Medication Sig Dispense Refill  . aspirin 81 MG chewable tablet Chew 81 mg by mouth daily.      . citalopram (CELEXA) 10 MG tablet Take 10 mg by mouth daily.      . clopidogrel (PLAVIX) 75 MG tablet Take 75 mg by mouth daily with breakfast.      . cyanocobalamin (,VITAMIN B-12,) 1000 MCG/ML injection Inject 1,000 mcg into the muscle every 7 (seven) days. Takes on Saturday       .  cyanocobalamin 500 MCG tablet Take 500 mcg by mouth daily.        [name] Sodium (DSS) 100 MG CAPS Take 100 mg by mouth 2 (two) times daily as needed. For constipation      . ferrous fumarate-iron polysaccharide complex (TANDEM) 162-115.2 MG CAPS Take 1 capsule by mouth daily.        Marland Kitchen gabapentin (NEURONTIN) 300 MG capsule Take 600 mg by mouth 3 (three) times daily.        Marland Kitchen HYDROcodone-acetaminophen (LORTAB) 10-500 MG per tablet Take 1 tablet by mouth every 6 (six) hours as needed. For pain      . metoprolol tartrate (LOPRESSOR) 25 MG tablet Take 25 mg by mouth 2 (two) times daily.      . Milnacipran (SAVELLA) 50 MG TABS Take 100 mg by mouth 2 (two) times daily.        . nitroGLYCERIN (NITROSTAT) 0.4 MG SL tablet Place 0.4 mg under the tongue every 5 (five) minutes as needed. For chest pain      . pantoprazole (PROTONIX) 40 MG tablet Take 40 mg by mouth daily at 6 (six) AM.      . rOPINIRole (REQUIP) 0.5 MG tablet Take 2 mg by mouth at bedtime.       . simvastatin (ZOCOR) 20 MG tablet Take 20 mg by mouth  daily at 6 PM.      . Vitamin D, Ergocalciferol, (DRISDOL) 50000 UNITS CAPS Take 50,000 Units by mouth every 7 (seven) days. Take on wednesday      . zolpidem (AMBIEN) 10 MG tablet Take 10 mg by mouth at bedtime as needed. For sleep      . DISCONTD: aspirin 81 MG chewable tablet Chew 1 tablet (81 mg total) by mouth daily.  30 tablet  10  . DISCONTD: citalopram (CELEXA) 10 MG tablet Take 1 tablet (10 mg total) by mouth daily.  30 tablet  10  . DISCONTD: clopidogrel (PLAVIX) 75 MG tablet Take 1 tablet (75 mg total) by mouth daily with breakfast.  30 tablet  6  . DISCONTD: docusate sodium 100 MG CAPS Take 100 mg by mouth 2 (two) times daily as needed for constipation.  10 capsule  3  . DISCONTD: metoprolol tartrate (LOPRESSOR) 25 MG tablet Take 1 tablet (25 mg total) by mouth 2 (two) times daily.  60 tablet  10  . DISCONTD: pantoprazole (PROTONIX) 40 MG tablet Take 1 tablet (40 mg total) by mouth daily at 6 (six) AM.  30 tablet  6  . DISCONTD: simvastatin (ZOCOR) 20 MG tablet Take 1 tablet (20 mg total) by mouth daily at 6 PM.  30 tablet  6  . DISCONTD: Multiple Vitamin (MULTIVITAMIN) tablet Take 1 tablet by mouth daily.        Marland Kitchen DISCONTD: tiZANidine (ZANAFLEX) 4 MG tablet Take 6 mg by mouth 3 (three) times daily.        Assessment: 6 YOF recently d/c'd from hospital (1/12) with STEMI.  No notes available in [facility] yet, consult for levofloxacin ordered.    Plan:  1. Based on recent SCr, start levofloxacin 750mg  IV q24h.   Kristy Sloan 03/20/2011,8:38 PM

## 2011-03-20 NOTE — Progress Notes (Signed)
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 She was seen by Dr Kirke Corin at [facility]. Admit orders written. Pt currently with 6/10 chest pain. Same symptoms she has been having. She feels a little better than she did earlier and on O2, is resting fairly well. With slightly low BP, will add IVF. Continue Rx started at [facility], including ABX and colchicine as well as home meds. Dr Kirke Corin stated we will continue Plavix and ASA with a recent DES but no other anticoagulation.  Re-assess in am.

## 2011-03-21 ENCOUNTER — Inpatient Hospital Stay (HOSPITAL_COMMUNITY): Payer: Self-pay

## 2011-03-21 DIAGNOSIS — I319 Disease of pericardium, unspecified: Secondary | ICD-10-CM

## 2011-03-21 LAB — DIFFERENTIAL
Basophils Absolute: 0 10*3/uL (ref 0.0–0.1)
Eosinophils Relative: 2 % (ref 0–5)
Lymphocytes Relative: 10 % — ABNORMAL LOW (ref 12–46)
Monocytes Absolute: 1.7 10*3/uL — ABNORMAL HIGH (ref 0.1–1.0)
Monocytes Relative: 12 % (ref 3–12)
Neutro Abs: 10.5 10*3/uL — ABNORMAL HIGH (ref 1.7–7.7)

## 2011-03-21 LAB — CARDIAC PANEL(CRET KIN+CKTOT+MB+TROPI)
CK, MB: 1 ng/mL (ref 0.3–4.0)
Relative Index: INVALID (ref 0.0–2.5)
Total CK: 22 U/L (ref 7–177)
Troponin I: <0.3 ng/mL (ref <0.30)

## 2011-03-21 LAB — CBC
HCT: 31.3 % — ABNORMAL LOW (ref 36.0–46.0)
Hemoglobin: 9.8 g/dL — ABNORMAL LOW (ref 12.0–15.0)
MCV: 85.1 fL (ref 78.0–100.0)
RDW: 14.5 % (ref 11.5–15.5)
WBC: 14 10*3/uL — ABNORMAL HIGH (ref 4.0–10.5)

## 2011-03-21 MED ORDER — HYDROCODONE-ACETAMINOPHEN 10-325 MG PO TABS
1.0000 | ORAL_TABLET | ORAL | Status: DC | PRN
  Administered 2011-03-21 – 2011-03-30 (×42): 1 via ORAL
  Filled 2011-03-21 (×43): qty 1

## 2011-03-21 MED ORDER — COLCHICINE 0.6 MG PO TABS
0.6000 mg | ORAL_TABLET | Freq: Two times a day (BID) | ORAL | Status: DC
  Administered 2011-03-21 – 2011-03-28 (×15): 0.6 mg via ORAL
  Filled 2011-03-21 (×16): qty 1

## 2011-03-21 MED ORDER — INFLUENZA VIRUS VACC SPLIT PF IM SUSP
0.5000 mL | INTRAMUSCULAR | Status: AC
  Administered 2011-03-22: 0.5 mL via INTRAMUSCULAR
  Filled 2011-03-21: qty 0.5

## 2011-03-21 NOTE — Progress Notes (Signed)
Subjective:   Kristy Sloan is a 51 year old female with a history of coronary artery disease, spinal stenosis, fibromyalgia, and restless leg syndrome.   She was admitted on January 2 with an acute anterior wall myocardial infarction. She had successful PTCA and stenting of her mid LAD. She had embolization of thrombus to the distal LAD and a subsequent apical myocardial infarction. She has had a repeat catheterization which revealed persistent occlusion of the apical LAD and she has developed pericarditis as a result. She's had persistent pleuritic chest pain since that time.  She was discharged to home on January 12 with chest pain,  neck and jaw pain, and back pain. She thought that her symptoms were similar to her presenting myocardial infarction and was transferred to [facility] for further evaluation.was readmitted to [facility] with recurrent chest pain.  She's feeling a little bit better this morning. She continues to have pleuritic chest pain     . aspirin EC  81 mg Oral Daily  . citalopram  10 mg Oral Daily  . clopidogrel  75 mg Oral Q breakfast  . colchicine  0.6 mg Oral Q4H  . cyanocobalamin  1,000 mcg Intramuscular Q7 days  . cyanocobalamin  500 mcg Oral Daily  . gabapentin  600 mg Oral TID  . influenza  inactive virus vaccine  0.5 mL Intramuscular Tomorrow-1000  . levofloxacin (LEVAQUIN) IV  750 mg Intravenous Q24H  . metoprolol tartrate  12.5 mg Oral BID  . Milnacipran  100 mg Oral BID  . multivitamins with iron  1 tablet Oral Daily  . pantoprazole  40 mg Oral Q0600  . rOPINIRole  2 mg Oral QHS  . simvastatin  20 mg Oral q1800  . sodium chloride  3 mL Intravenous Q12H  . tiZANidine  6 mg Oral TID  . Vitamin D (Ergocalciferol)  50,000 Units Oral Q7 days  . DISCONTD: aspirin  81 mg Oral Daily  . DISCONTD: aspirin EC  81 mg Oral Daily  . DISCONTD: clopidogrel  75 mg Oral Q breakfast  . DISCONTD: clopidogrel  75 mg Oral Q breakfast  . DISCONTD: colchicine  0.6 mg Oral  BID  . DISCONTD: ferrous fumarate-iron polysaccharide complex  1 capsule Oral Daily  . DISCONTD: metoprolol tartrate  25 mg Oral BID  . DISCONTD: mulitivitamin with minerals  1 tablet Oral Daily  . DISCONTD: multivitamin  1 tablet Oral Daily      . sodium chloride 75 mL/hr at 03/20/11 2220    Objective:  Vital Signs in the last 24 hours: Blood pressure 103/55, pulse 102, temperature 98.5 F (36.9 C), temperature source Oral, resp. rate 24, height 5\' 3"  (1.6 m), weight 223 lb 8.7 oz (101.4 kg), last menstrual period 03/02/2011, SpO2 98.00%. Temp:  [97.5 F (36.4 C)-98.5 F (36.9 C)] 98.5 F (36.9 C) (01/18 0403) Pulse Rate:  [102-112] 102  (01/18 0403) Resp:  [17-24] 24  (01/18 0403) BP: (103-112)/(55-73) 103/55 mmHg (01/18 0403) SpO2:  [96 %-98 %] 98 % (01/18 0403) Weight:  [223 lb 8.7 oz (101.4 kg)] 223 lb 8.7 oz (101.4 kg) (01/17 1917)  Intake/Output from previous day: 01/17 0701 - 01/18 0700 In: 600 [I.V.:450; IV Piggyback:150] Out: 350 [Urine:350] Intake/Output from this shift:    Physical Exam: The patient is alert and oriented x 3.  The mood and affect are normal.   Skin: warm and dry.  Color is normal.    HEENT:   the sclera are nonicteric.  The mucous membranes  are moist.  The carotids are 2+ without bruits.  There is no thyromegaly.  There is no JVD.    Lungs:  the left base has decreased breath sounds.   Heart: regular rate with a normal S1 and S2.  there is a 3 component friction rub.  Abdomen: good bowel sounds.  There is no guarding or rebound.  There is no hepatosplenomegaly or tenderness.  There are no masses.   Extremities:   Let us pitting edema.  The legs are without rashes.  The distal pulses are intact.   Neuro:  Cranial nerves II - XII are intact.  Motor and sensory functions are intact.     Lab Results:  Basename 03/21/11 0200  WBC 14.0*  HGB 9.8*  PLT 458*   No results found for this basename:  NA:2,K:2,CL:2,CO2:2,GLUCOSE:2,BUN:2,CREATININE:2 in the last 72 hours  Basename 03/21/11 0200 03/20/11 2010  TROPONINI <0.30 <0.30   No results found for this basename: BNP in the last 72 hours Hepatic Function Panel No results found for this basename: PROT,ALBUMIN,AST,ALT,ALKPHOS,BILITOT,BILIDIR,IBILI in the last 72 hours Lab Results  Component Value Date   CHOL 237* 03/05/2011   HDL 34* 03/05/2011   LDLCALC 177* 03/05/2011   TRIG 128 03/05/2011   CHOLHDL 7.0 03/05/2011    Basename 03/20/11 2010  INR 1.23    Cardiac enzymes are negative x2 sets.  Tele:  Sinus tach. ECG:  Sinus tach , Inferior Q waves with persistent ST elevation.  There is persistent TWI in the anterior lateral leads.  Echocardiogram performed at [facility] hospital: This revealed normal left ventricular systolic function with an ejection fraction of 55-60%. There was apical akinesis but no evidence of an apical thrombus. There was a moderate sized circumferential pericardial effusion.  There is no right ventricular or right atrial collapse.  Assessment/Plan:   1. Dressler's pericarditis :  The patient presents with symptoms that are consistent with Dressler pericarditis.  The cardiac enzymes are negative and there is no evidence of recurrent myocardial infarction. Her symptoms are consistent with pericarditis. We'll continue treatment for pericarditis. We'll consider using Colchicine.  2.  left pleural effusion/ left pulmonary infiltrate: The patient has decreased breath sounds with E. to A changes on exam. We'll get a repeat PA and lateral chest x-ray today.   Disposition: Transfer to tele today.  Vesta Mixer, Montez Hageman., MD, [facility] 03/21/2011, 8:10 AM LOS: Day 1

## 2011-03-21 NOTE — Progress Notes (Signed)
Pt states she has been sweating all the time since her MI. She cannot "cool" down. Pt also states she has been urinating frequently and sometimes has a burning sensation while urinating. Will make MD aware of this and continue to monitor.

## 2011-03-21 NOTE — Progress Notes (Signed)
I have seen and examined the patient at [facility] emergency room. She had a recent anterior myocardial infarction status post LAD PCI. The LAD continued to be occluded close to the apex due to distal embolization. She presents with flulike symptoms with high grade fever generalized aching as well as pleuritic chest pain. She has a pericardial rub on physical exam. D-dimer was elevated. CTA showed no PE but there was a moderate size pericardial effusion. A stat echo was performed which showed moderate-sized pericardial effusion with very early signs of tamponade but without RV or RA collapse. 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 Much less likely to be subacute free wall rupture. I recommend colchicine and IV fluids. Avoid anticoagulation. I suggest repeating a limited echocardiogram in few days to ensure that the effusion is not enlarging.  Lorine Bears 03/21/2011 12:13 PM

## 2011-03-21 NOTE — Progress Notes (Signed)
Order to transfer pt to telemetry floor.  Bed available to 2000.  Report called to [facility] 2000.  Pt belongings packed and pt and husband informed of transfer.  All belongings packed.  Pt to transfer via WC to 2029.

## 2011-03-21 NOTE — Progress Notes (Addendum)
   CARE MANAGEMENT NOTE 03/21/2011  Patient:  Kristy Sloan, Kristy Sloan   Account Number:  [id number]  Date Initiated:  03/21/2011  Documentation initiated by:  GRAVES-BIGELOW,Zayden Maffei  Subjective/Objective Assessment:   Pt was just recently d/c 03-15-11. Readmit with recurrent pleuritic cp. The cardiac enzymes are negative and there is no evidence of recurrent myocardial infarction. Plan to tx to tele today.     Action/Plan:   CM will speak to pt to see if she has any needs. She was given a 3 day supply f medications on last admission. Pt has a PCP in [place].   Anticipated DC Date:  03/22/2011   Anticipated DC Plan:  HOME/SELF CARE         Choice offered to / List presented to:             Status of service:  Completed, signed off Medicare Important Message given?   (If response is "NO", the following Medicare IM given date fields will be blank) Date Medicare IM given:   Date Additional Medicare IM given:    Discharge Disposition:  HOME/SELF CARE  Per UR Regulation:    Comments:    PCP: MD [facility] in [place].  03-20-11 1154 Tomi Bamberger, RN,BSN [phone] Pt may benefit from [facility] at d/c. Will continue to monitor for d/c disposition.

## 2011-03-21 NOTE — Progress Notes (Deleted)
    CARE MANAGEMENT NOTE 03/21/2011  Patient:  Kristy Sloan, Kristy Sloan   Account Number:  [id number]  Date Initiated:  03/21/2011  Documentation initiated by:  GRAVES-BIGELOW,Konni Kesinger  Subjective/Objective Assessment:   Pt was just recently d/c 03-15-11. Readmit with recurrent pleuritic cp. The cardiac enzymes are negative and there is no evidence of recurrent myocardial infarction. Plan to tx to tele today.     Action/Plan:   CM will speak to pt to see if she has any needs. She was given a 3 day supply f medications on last admission. Pt has a PCP in [place].   Anticipated DC Date:  03/22/2011   Anticipated DC Plan:  HOME/SELF CARE         Choice offered to / List presented to:             Status of service:  Completed, signed off Medicare Important Message given?   (If response is "NO", the following Medicare IM given date fields will be blank) Date Medicare IM given:   Date Additional Medicare IM given:    Discharge Disposition:  HOME/SELF CARE  Per UR Regulation:    Comments:

## 2011-03-22 DIAGNOSIS — I2109 ST elevation (STEMI) myocardial infarction involving other coronary artery of anterior wall: Secondary | ICD-10-CM

## 2011-03-22 DIAGNOSIS — J181 Lobar pneumonia, unspecified organism: Secondary | ICD-10-CM

## 2011-03-22 DIAGNOSIS — I309 Acute pericarditis, unspecified: Secondary | ICD-10-CM

## 2011-03-22 DIAGNOSIS — R079 Chest pain, unspecified: Secondary | ICD-10-CM

## 2011-03-22 MED ORDER — HYDROCOD POLST-CHLORPHEN POLST 10-8 MG/5ML PO LQCR: 5.0000 mL | Freq: Four times a day (QID) | ORAL | Status: DC | PRN

## 2011-03-22 MED ORDER — LEVOFLOXACIN 750 MG PO TABS
750.0000 mg | ORAL_TABLET | Freq: Every day | ORAL | Status: DC
  Administered 2011-03-22 – 2011-03-26 (×5): 750 mg via ORAL
  Filled 2011-03-22 (×5): qty 1

## 2011-03-22 MED ORDER — ENOXAPARIN SODIUM 40 MG/0.4ML ~~LOC~~ SOLN
40.0000 mg | SUBCUTANEOUS | Status: DC
  Administered 2011-03-22 – 2011-03-29 (×7): 40 mg via SUBCUTANEOUS
  Filled 2011-03-22 (×9): qty 0.4

## 2011-03-22 MED ORDER — NAPROXEN 375 MG PO TABS
375.0000 mg | ORAL_TABLET | Freq: Three times a day (TID) | ORAL | Status: DC
  Administered 2011-03-22 – 2011-03-26 (×13): 375 mg via ORAL
  Filled 2011-03-22 (×15): qty 1

## 2011-03-22 NOTE — Progress Notes (Signed)
Kristy Sloan  51 y.o.  female  Subjective: Continued chest pain, malaise, subjective fever without rigors, non-productive cough, dizziness without falls or syncope, difficulty speaking.  No h/o GI problems.  Allergy: Review of patient's allergies indicates no known allergies.  Objective: Vital signs in last 24 hours: Temp:  [98 F (36.7 C)-98.2 F (36.8 C)] 98 F (36.7 C) (01/19 0434) Pulse Rate:  [86-104] 86  (01/19 0434) Resp:  [17-19] 19  (01/19 0434) BP: (92-109)/(55-65) 99/63 mmHg (01/19 0434) SpO2:  [96 %-100 %] 98 % (01/19 0434) Weight:  [103.602 kg (228 lb 6.4 oz)] 103.602 kg (228 lb 6.4 oz) (01/19 0434)  103.602 kg (228 lb 6.4 oz) Body mass index is 40.46 kg/(m^2).  Weight change: 2.202 kg (4 lb 13.7 oz) Last BM Date: 03/21/11  Intake/Output from previous day: 01/18 0701 - 01/19 0700 In: 1308 [P.O.:1080; I.V.:228] Out: 200 [Urine:200]  General- Well developed; no acute distress; BP 92/50 without paradox Neck- No JVD, no carotid bruits Lungs- bronchial breath sounds and egophony at the left base; normal I:E ratio Cardiovascular-  PMI not palpable; distant S1 and S2 Abdomen- normal bowel sounds; soft and non-tender without masses or organomegaly Skin- Warm, no significant lesions Extremities- Nl distal pulses; no edema  Lab Results: Cardiac Markers:   Basename 03/21/11 0744 03/21/11 0200  TROPONINI <0.30 <0.30   CBC:   Basename 03/21/11 0200  WBC 14.0*  HGB 9.8*  HCT 31.3*  PLT 458*    Imaging Studies/Results: Dg Chest 2 View  03/21/2011  *RADIOLOGY REPORT*  Clinical Data: Decreased breath sounds  CHEST - 2 VIEW  Comparison: 03/20/2011  Findings: Stable cardiac enlargement.  Left pleural effusion and left lower lobe atelectasis/infiltrate is again noted and appears unchanged from previous exam.  Right lung is clear.  IMPRESSION:  1.  No significant change in aeration to the left lower lobe. 2.  Left pleural effusion. 3.  Cardiac enlargement which may in part  reflect pericardial effusion.  Original Report Authenticated By: Rosealee Albee, M.D.    Imaging: Imaging results have been reviewed  Medications:  I have reviewed the patient's current medications. Scheduled:   . aspirin EC  81 mg Oral Daily  . citalopram  10 mg Oral Daily  . clopidogrel  75 mg Oral Q breakfast  . colchicine  0.6 mg Oral BID  . cyanocobalamin  1,000 mcg Intramuscular Q7 days  . cyanocobalamin  500 mcg Oral Daily  . gabapentin  600 mg Oral TID  . influenza  inactive virus vaccine  0.5 mL Intramuscular Tomorrow-1000  . levofloxacin (LEVAQUIN) IV  750 mg Intravenous Q24H  . metoprolol tartrate  12.5 mg Oral BID  . Milnacipran  100 mg Oral BID  . multivitamins with iron  1 tablet Oral Daily  . pantoprazole  40 mg Oral Q0600  . rOPINIRole  2 mg Oral QHS  . simvastatin  20 mg Oral q1800  . sodium chloride  3 mL Intravenous Q12H  . tiZANidine  6 mg Oral TID  . Vitamin D (Ergocalciferol)  50,000 Units Oral Q7 days    Assessment/Plan: Pericarditis,post-MI.  Colchicine started.  Will add non-steroidal at moderate dose.  No evidence for tamponade except possibly low BP  LLL atelectasis vs pneumonia; pleural effusion may be related to pleuropericarditis or pneumonia.  Antibiotic Rx in progress.  Myalgias-possible viral syndrome, influenza or pneumonia.  Blood cultures not reported.  Will check for influenza A, but on the late side for anti-virals.  LOS: 2  days   [place] Bing 03/22/2011, 10:10 AM

## 2011-03-23 LAB — INFLUENZA PANEL BY PCR (TYPE A & B)
H1N1 flu by pcr: NOT DETECTED
Influenza B By PCR: NEGATIVE

## 2011-03-23 NOTE — Progress Notes (Signed)
Kristy Sloan  51 y.o.  female  Subjective: Remains afebrile.  I&O approximately equal.  Modest ongoing chest pain and dyspnea.  Malaise & non-productive cough improved.  Ambulating in room without difficulty.  Allergy: None reported  Objective: Vital signs in last 24 hours: Temp:  [97.5 F (36.4 C)-98 F (36.7 C)] 97.5 F (36.4 C) (01/20 0515) Pulse Rate:  [84-109] 109  (01/20 0515) Resp:  [16-18] 17  (01/20 0515) BP: (103-147)/(65-83) 147/83 mmHg (01/20 0515) SpO2:  [96 %-100 %] 96 % (01/20 0515) Weight:  [103.375 kg (227 lb 14.4 oz)] 103.375 kg (227 lb 14.4 oz) (01/20 0515)  103.375 kg (227 lb 14.4 oz) Body mass index is 40.37 kg/(m^2).  Weight change: -0.227 kg (-8 oz) Last BM Date: 03/22/11  Intake/Output from previous day: 01/19 0701 - 01/20 0700 In: 360 [P.O.:360] Out: 400 [Urine:400]  General- Well developed; no acute distress; BP 92/50 without paradox Neck- No JVD, no carotid bruits Lungs- bronchial breath sounds and egophony at the left base; normal I:E ratio Cardiovascular-  PMI not palpable; distant S1 and S2; G2/6 basilar SEM Abdomen- normal bowel sounds; soft and non-tender without masses or organomegaly Skin- Warm, no significant lesions Extremities- Nl distal pulses; no edema  Lab Results: No lab today.  Imaging Studies/Results: Dg Chest 2 View  03/21/2011  *RADIOLOGY REPORT*  Clinical Data: Decreased breath sounds  CHEST - 2 VIEW  Comparison: 03/20/2011  Findings: Stable cardiac enlargement.  Left pleural effusion and left lower lobe atelectasis/infiltrate is again noted and appears unchanged from previous exam.  Right lung is clear.  IMPRESSION:  1.  No significant change in aeration to the left lower lobe. 2.  Left pleural effusion. 3.  Cardiac enlargement which may in part reflect pericardial effusion.  Original Report Authenticated By: Rosealee Albee, M.D.    Imaging: Imaging results have been reviewed  Medications:  I have reviewed the patient's  current medications. Scheduled: . aspirin EC  81 mg Oral Daily  . citalopram  10 mg Oral Daily  . clopidogrel  75 mg Oral Q breakfast  . colchicine  0.6 mg Oral BID  . cyanocobalamin  1,000 mcg Intramuscular Q7 days  . cyanocobalamin  500 mcg Oral Daily  . enoxaparin (LOVENOX) injection  40 mg Subcutaneous Q24H  . gabapentin  600 mg Oral TID  . influenza  inactive virus vaccine  0.5 mL Intramuscular Tomorrow-1000  . levofloxacin  750 mg Oral Daily  . Milnacipran  100 mg Oral BID  . multivitamins with iron  1 tablet Oral Daily  . naproxen  375 mg Oral TID  . pantoprazole  40 mg Oral Q0600  . rOPINIRole  2 mg Oral QHS  . sodium chloride  3 mL Intravenous Q12H  . tiZANidine  6 mg Oral TID  . Vitamin D (Ergocalciferol)  50,000 Units Oral Q7 days  . DISCONTD: levofloxacin (LEVAQUIN) IV  750 mg Intravenous Q24H  . DISCONTD: metoprolol tartrate  12.5 mg Oral BID  . DISCONTD: simvastatin  20 mg Oral q1800    Assessment/Plan: Pericarditis,post-MI.  Rx-Colchicine+naproxen.  Improving.  LLL atelectasis vs pneumonia;  Antibiotic Rx continues.  Myalgias-possible viral syndrome, influenza PCR requested.  Will D/c SCD and encourage increased activity.  Should be OK for discharge in 1-2 days.  [place] Bing 03/23/2011, 8:24 AM

## 2011-03-23 NOTE — Progress Notes (Signed)
03/23/2011 2:05 PM Nursing Note Patient ambulated in hallway with RN and on RA. Pt. Tolerated walk fair. O2 saturations remained greater than 90% during ambulation. Encouraged another walk this evening with staff.  Lydia Meng, Blanchard Kelch'

## 2011-03-24 ENCOUNTER — Inpatient Hospital Stay (HOSPITAL_COMMUNITY): Payer: Self-pay

## 2011-03-24 DIAGNOSIS — I319 Disease of pericardium, unspecified: Secondary | ICD-10-CM

## 2011-03-24 LAB — BASIC METABOLIC PANEL
BUN: 15 mg/dL (ref 6–23)
Calcium: 8.7 mg/dL (ref 8.4–10.5)
Creatinine, Ser: 0.66 mg/dL (ref 0.50–1.10)
GFR calc Af Amer: >90 mL/min (ref >90)
GFR calc non Af Amer: >90 mL/min (ref >90)
Glucose, Bld: 109 mg/dL — ABNORMAL HIGH (ref 70–99)

## 2011-03-24 NOTE — Progress Notes (Signed)
  Echocardiogram 2D Echocardiogram (Limited) has been performed.  Kristy Sloan, Real Cons 03/24/2011, 5:10 PM

## 2011-03-24 NOTE — Progress Notes (Signed)
Pt ambulated in halls on RA with RN and walker.  Sats fluctuated between 93-99% RA during duration of walk for 300 feet.  Pt maintained sat of 99% on RA once sitting.  Will keep pt on RA.  Will continue to monitor and encourage. Ave Filter

## 2011-03-24 NOTE — Progress Notes (Signed)
Utilization review completed. Savan Ruta, RN, BSN. 03/24/11 

## 2011-03-24 NOTE — Progress Notes (Signed)
Subjective: Chest feels tight with walking.  Sl CP with sitting.  Tightness gets worse with walking  Had to take a breathing treatment yesterday.  A little dizziness yesterday.  NO syncope. Objective: Filed Vitals:   03/23/11 1640 03/23/11 1803 03/23/11 2039 03/24/11 0417  BP: 122/70 104/71 102/67 114/69  Pulse: 108 93 86 90  Temp:  97.7 F (36.5 C) 97.7 F (36.5 C) 98.1 F (36.7 C)  TempSrc:  Oral Oral Oral  Resp:   19 18  Height:      Weight:    227 lb 8.2 oz (103.2 kg)  SpO2:  100% 100% 96%   Weight change: -6.2 oz (-0.175 kg)  Intake/Output Summary (Last 24 hours) at 03/24/11 0841 Last data filed at 03/24/11 0300  Gross per 24 hour  Intake    360 ml  Output    600 ml  Net   -240 ml    General: Alert, awake, oriented x3, in no acute distress Neck:  JVP is normal Heart: Regular rate and rhythm, without murmurs, rubs, gallops.  Lungs: Clear to auscultation.  No rales or wheezes on forced expiration. Exemities:  No edema.   Neuro: Grossly intact, nonfocal.  Tele:  SR/ST.  Lab Results: No results found for this or any previous visit (from the past 24 hour(s)).  Studies/Results: No results found.  Medications: I have reviewed the patient's current medications.   Patient Active Hospital Problem List: Pericarditis, acute (03/13/2011)  Dressler's syndrome   Assessment:Continue current regimen.  I would get limited echo if not done to reeval size of effusion esp with little dizziness.   Plan:   CAD:  I am not convinced symptoms of tightness are angina. WIll check BMET and BNP as well as o2 sat walking.  Lung consolidation (03/22/2011)   Assessment: On ABX.  WIll repeat XCR.  Dyslipidemia:  COntinue statin.     LOS: 4 days   Dietrich Pates 03/24/2011, 8:41 AM

## 2011-03-25 ENCOUNTER — Inpatient Hospital Stay (HOSPITAL_COMMUNITY): Payer: Self-pay

## 2011-03-25 ENCOUNTER — Encounter (HOSPITAL_COMMUNITY): Payer: Self-pay | Admitting: Radiology

## 2011-03-25 DIAGNOSIS — I309 Acute pericarditis, unspecified: Secondary | ICD-10-CM

## 2011-03-25 MED ORDER — FUROSEMIDE 10 MG/ML IJ SOLN
20.0000 mg | Freq: Once | INTRAMUSCULAR | Status: AC
  Administered 2011-03-25: 20 mg via INTRAVENOUS
  Filled 2011-03-25: qty 2

## 2011-03-25 NOTE — Progress Notes (Signed)
Subjective:  She seems some better.  Some mild discomfort that hurts most of the time.  See echo results.    Objective:  Vital Signs in the last 24 hours: Temp:  [97.6 F (36.4 C)-97.9 F (36.6 C)] 97.6 F (36.4 C) (01/22 0517) Pulse Rate:  [90-93] 90  (01/22 0517) Resp:  [18-19] 19  (01/22 0517) BP: (114-123)/(72-73) 114/72 mmHg (01/22 0517) SpO2:  [97 %] 97 % (01/22 0517) Weight:  [103.8 kg (228 lb 13.4 oz)] 103.8 kg (228 lb 13.4 oz) (01/22 0517)  Intake/Output from previous day: 01/21 0701 - 01/22 0700 In: 870 [P.O.:870] Out: 1350 [Urine:1350]   Physical Exam: General: Well developed, well nourished, in no acute distress.  Good mood.  Head:  Normocephalic and atraumatic. Lungs: Decrease BS bilaterally Heart: Normal S1 and S2.  No definite rub on exam.  Do not appreciate a gallop. Or murmur.  Extremities: No clubbing or cyanosis. No edema. Neurologic: Alert and oriented x 3.    Lab Results: No results found for this basename: WBC:2,HGB:2,PLT:2 in the last 72 hours  Basename 03/24/11 0902  NA 141  K 3.8  CL 107  CO2 27  GLUCOSE 109*  BUN 15  CREATININE 0.66   No results found for this basename: TROPONINI:2,CK,MB:2 in the last 72 hours Hepatic Function Panel No results found for this basename: PROT,ALBUMIN,AST,ALT,ALKPHOS,BILITOT,BILIDIR,IBILI in the last 72 hours No results found for this basename: CHOL in the last 72 hours No results found for this basename: PROTIME in the last 72 hours  Imaging: Dg Chest 2 View  03/24/2011  *RADIOLOGY REPORT*  Clinical Data: Shortness of breath, coronary disease, pneumonia  CHEST - 2 VIEW  Comparison: 03/21/2011  Findings: Borderline cardiac enlargement without edema.  Left greater than right pleural effusions evident with interval enlargement in the left effusion.  Increased lingula and left lower lobe collapse / consolidation.  No pneumothorax.  Trachea midline.  IMPRESSION: Slight enlargement in the left effusion with lingula  and left lower lobe consolidation/collapse.  Small developing right effusion  Original Report Authenticated By: Judie Petit. Ruel Favors, M.D.    EKG:  Not done  Cardiac Studies:  See 2D echo report  Assessment/Plan:  Patient Active Hospital Problem List: Pericarditis, acute (03/13/2011)   Assessment: no rub, but suggestion of Dressler's seems appropriate   Plan: Continue colchicine at present, and add diuretic  ?mild volume overload Lung consolidation (03/22/2011)   Assessment: non contrast CT of chest    Plan: pulmonary consult, continue antibiotics.  Kristy Sloan 8:37 AM 03/25/2011       Kristy Pons, MD, [facility], FSCAI 03/25/2011, 8:29 AM

## 2011-03-25 NOTE — Consult Note (Signed)
Name: Kristy Sloan MRN: [id number] DOB: 1960-07-13    LOS: 5  PCCM ADMISSION NOTE  History of Present Illness: 51 y/o w with pmh of fibromyalgia, anxiety, depression and no other cardio-respi problem was intially admitted 1/2 with acute inferior STEMI. She underwent left heart cath in 1/2 showing ruptured proximal LAD lesion with distal embolization and occlusion of apical LAD s/p PCI with DES of proximal LAD. She continued to have chest pain with persistent ST elevation and had repeat cath 1/3 with no change in appearance or intervention. She was discharged in stable condition 1/12. She presented to [facility] 1/17 with flu like symptoms, fever, generalized aching and pleuritic chest pain b/l, more on the left. Echo and CTA showed moderate pericardial effusion c/w Dressler's syndrome. She was transferred to [facility] based on patient's wishes. She has been treated with colchicine since re-admission. Follow up chest xray have showed developing left sided pleural effusion with LLL atelactasis vs pneumonia for which she is on levoquin. PCCM was consulted for management of pleural effusion.   Patient c/o chest pain b/l, more on left with deep inspiration, walking few steps or coughing. This symptoms have been persistent since her MI, improves with pain killers but does not resolve. She also has dry cough, chills and fatigue. Mild shortness of breath with walking,   Lines / Drains: Peripheral iv  Cultures: 1/20: Flu PCR - neg 1/17: blood cx- NEG  Antibiotics: 1/19 levaquin po>> 1/23  Tests / Events:  1/22: CT chest:  1.decrease in the amount of  intermediate attenuation (likely exudative) pericardial fluid.  This finding may reflect postinfarct pericarditis (so-called  "Dressler syndrome").  2. Atherosclerosis, including two-vessel coronary artery disease,  status post PTCI to LAD.  3. Small - moderate simple appearing layering left pleural  effusion and trace right-sided pleural  effusion layering  dependently. There is associated atelectasis of much of the left  lower lobe.  4. Small hiatal hernia.   Past Medical History  Diagnosis Date  . Anxiety   . Spinal stenosis   . Fibromyalgia   . Restless legs syndrome   . CAD in native artery     A.  03/05/2011 - Ant MI - LAD stented w/ 2.75 x 16 mm Promus  DES  . Tobacco abuse     reports quitting 12/2010  . Pericarditis     In setting of MI 03/2011  . Angina   . Shortness of breath   . Sleep apnea   . Coronary artery disease   . GERD (gastroesophageal reflux disease)   . Headache    Past Surgical History  Procedure Date  . Hand surgery   . Cesarean section   . Cardiac catheterization   . Coronary angioplasty   . Tubal ligation    Prior to Admission medications   Medication Sig Start Date End Date Taking? Authorizing Provider  aspirin 81 MG chewable tablet Chew 81 mg by mouth daily. 03/15/11 03/14/12 Yes Joni Reining, NP  citalopram (CELEXA) 10 MG tablet Take 10 mg by mouth daily. 03/15/11 03/14/12 Yes Joni Reining, NP  clopidogrel (PLAVIX) 75 MG tablet Take 75 mg by mouth daily with breakfast. 03/15/11 03/14/12 Yes Joni Reining, NP  cyanocobalamin (,VITAMIN B-12,) 1000 MCG/ML injection Inject 1,000 mcg into the muscle every 7 (seven) days. Takes on Saturday  03/08/11  Yes Historical Provider, MD  cyanocobalamin 500 MCG tablet Take 500 mcg by mouth daily.     Yes Historical Provider, MD  Docusate Sodium (DSS)  100 MG CAPS Take 100 mg by mouth 2 (two) times daily as needed. For constipation 03/15/11 03/25/11 Yes Joni Reining, NP  ferrous fumarate-iron polysaccharide complex (TANDEM) 162-115.2 MG CAPS Take 1 capsule by mouth daily.     Yes Historical Provider, MD  gabapentin (NEURONTIN) 300 MG capsule Take 600 mg by mouth 3 (three) times daily.     Yes Historical Provider, MD  HYDROcodone-acetaminophen (LORTAB) 10-500 MG per tablet Take 1 tablet by mouth every 6 (six) hours as needed. For pain   Yes  Historical Provider, MD  metoprolol tartrate (LOPRESSOR) 25 MG tablet Take 25 mg by mouth 2 (two) times daily. 03/15/11 03/14/12 Yes Joni Reining, NP  Milnacipran (SAVELLA) 50 MG TABS Take 100 mg by mouth 2 (two) times daily.     Yes Historical Provider, MD  nitroGLYCERIN (NITROSTAT) 0.4 MG SL tablet Place 0.4 mg under the tongue every 5 (five) minutes as needed. For chest pain   Yes Historical Provider, MD  pantoprazole (PROTONIX) 40 MG tablet Take 40 mg by mouth daily at 6 (six) AM. 03/15/11 03/14/12 Yes Joni Reining, NP  rOPINIRole (REQUIP) 0.5 MG tablet Take 2 mg by mouth at bedtime.    Yes Historical Provider, MD  simvastatin (ZOCOR) 20 MG tablet Take 20 mg by mouth daily at 6 PM. 03/15/11 03/14/12 Yes Joni Reining, NP  Vitamin D, Ergocalciferol, (DRISDOL) 50000 UNITS CAPS Take 50,000 Units by mouth every 7 (seven) days. Take on wednesday   Yes Historical Provider, MD  zolpidem (AMBIEN) 10 MG tablet Take 10 mg by mouth at bedtime as needed. For sleep   Yes Historical Provider, MD   Allergies No Known Allergies  Family History Family History  Problem Relation Age of Onset  . Coronary artery disease Father   . Coronary artery disease Sister     Posey Rea of age, but sister is younger    Social History  reports that she quit smoking about 3 months ago. Her smoking use included Cigarettes. She does not have any smokeless tobacco history on file. She reports that she does not drink alcohol or use illicit drugs.  Review Of Systems  11 points review of systems is negative with an exception of listed in HPI.  Vital Signs: Temp:  [97.6 F (36.4 C)-98.3 F (36.8 C)] 98.2 F (36.8 C) (01/22 2015) Pulse Rate:  [90-93] 92  (01/22 2015) Resp:  [18-19] 18  (01/22 2015) BP: (113-130)/(68-73) 113/73 mmHg (01/22 2015) SpO2:  [97 %-98 %] 98 % (01/22 2015) Weight:  [228 lb 13.4 oz (103.8 kg)] 228 lb 13.4 oz (103.8 kg) (01/22 0517) I/O last 3 completed shifts: In: 2190 [P.O.:2190] Out: 2850  [Urine:2850]  Physical Examination: General:  Awake, lying in bed, NAD Neuro:  Non focal grossly   HEENT:  PERRLA, EOMI, mucosa moist Neck:  Supple, no JVD   Cardiovascular:  RRR, no murmur, no rub Lungs:  B/l clear sounds, reduce air entry on bases b/l more on left Abdomen:  S/obese,non tender. Multiple bruises  Extremity:  No edema Skin:  No rashes  Labs and Imaging:    CBC:    Component Value Date/Time   WBC 14.0* 03/21/2011 0200   HGB 9.8* 03/21/2011 0200   HCT 31.3* 03/21/2011 0200   PLT 458* 03/21/2011 0200   MCV 85.1 03/21/2011 0200   NEUTROABS 10.5* 03/21/2011 0200   LYMPHSABS 1.4 03/21/2011 0200   MONOABS 1.7* 03/21/2011 0200   EOSABS 0.3 03/21/2011 0200   BASOSABS 0.0 03/21/2011 0200  BMET    Component Value Date/Time   NA 141 03/24/2011 0902   K 3.8 03/24/2011 0902   CL 107 03/24/2011 0902   CO2 27 03/24/2011 0902   GLUCOSE 109* 03/24/2011 0902   BUN 15 03/24/2011 0902   CREATININE 0.66 03/24/2011 0902   CALCIUM 8.7 03/24/2011 0902   GFRNONAA >90 03/24/2011 0902   GFRAA >90 03/24/2011 0902    CXR: Slight enlargement in the left effusion with lingula and left lower lobe consolidation/collapse. Small developing right effusion  Assessment and Plan:  51 y/o w with inferior wall MI s/p PCI with DES on aspirin and plavix re-admitted 1/17 with Dresslers syndrome and new pleural effusion  1. Pleural effusion:  - most likely exudative from pleruo-pericarditis and/or pneumonia.  - there does not seem to be any respiratory compromise from the pleural effusion at this time.   Plan - continue current anti inflammatory treatment with colchicine. May add steroid or NSAIDS if ok with cardiology.  - Continue lasix - Levoquin to complete 7-10 day course, broaden coverage for HCAP if continues to spike fever or develops definite infiltrate on CXR.  - Repeat CXR Q2-3 days to follow up on the pleural effusion - If worsening, will plan thoracentesis in 2-3 day.    DEVANI,MADHAV 03/25/2011, 8:23 PM   ATTENDING MD ADDENDUM: Pt seen and examined and database reviewed. I agree with above findings, assessment and plan. I think this is almost certainly a post MI/Dressler's related L pleural eff. I strongly doubt PNA with parapneumonic effusion. If we were to perform thoracentesis, it is likely that we would make her pain worse by bringing inflamed pleural surfaces together. I have stopped Levofloxacin. I have ordered a brief course of solumedrol X 1 with 4 days of modest dose prednisone to follow. Will recheck CXR in AM 1/24. Ultimately, she will need a f/u CXR after discharge to ensure complete resolution  Billy Fischer, MD;  PCCM service; Mobile [phone]

## 2011-03-26 ENCOUNTER — Encounter: Payer: Self-pay | Admitting: Cardiology

## 2011-03-26 DIAGNOSIS — J90 Pleural effusion, not elsewhere classified: Secondary | ICD-10-CM

## 2011-03-26 DIAGNOSIS — R079 Chest pain, unspecified: Secondary | ICD-10-CM

## 2011-03-26 DIAGNOSIS — I309 Acute pericarditis, unspecified: Secondary | ICD-10-CM

## 2011-03-26 LAB — CBC
Hemoglobin: 10.2 g/dL — ABNORMAL LOW (ref 12.0–15.0)
MCH: 26.4 pg (ref 26.0–34.0)
MCHC: 31.4 g/dL (ref 30.0–36.0)
RDW: 14.9 % (ref 11.5–15.5)

## 2011-03-26 LAB — BASIC METABOLIC PANEL
BUN: 15 mg/dL (ref 6–23)
Calcium: 9 mg/dL (ref 8.4–10.5)
Creatinine, Ser: 0.63 mg/dL (ref 0.50–1.10)
GFR calc Af Amer: >90 mL/min (ref >90)
GFR calc non Af Amer: >90 mL/min (ref >90)
Glucose, Bld: 92 mg/dL (ref 70–99)
Potassium: 4.1 mEq/L (ref 3.5–5.1)

## 2011-03-26 MED ORDER — PREDNISONE 20 MG PO TABS
40.0000 mg | ORAL_TABLET | Freq: Every day | ORAL | Status: DC
  Administered 2011-03-27: 40 mg via ORAL
  Filled 2011-03-26 (×2): qty 2

## 2011-03-26 MED ORDER — FUROSEMIDE 10 MG/ML IJ SOLN
20.0000 mg | Freq: Once | INTRAMUSCULAR | Status: AC
  Administered 2011-03-26: 20 mg via INTRAVENOUS
  Filled 2011-03-26: qty 2

## 2011-03-26 MED ORDER — METHYLPREDNISOLONE SODIUM SUCC 125 MG IJ SOLR
80.0000 mg | Freq: Once | INTRAMUSCULAR | Status: AC
  Administered 2011-03-26: 80 mg via INTRAVENOUS
  Filled 2011-03-26: qty 2

## 2011-03-26 NOTE — Progress Notes (Signed)
Patient ambulated approx.  500 ft with rolling walker.  Room air sats maintained 95-96% during ambulation.  HR 110-124.  Pt returned to room.  No complaints at present.

## 2011-03-26 NOTE — Progress Notes (Signed)
   CARE MANAGEMENT NOTE 03/26/2011  Patient:  Kristy Sloan, Kristy Sloan   Account Number:  [id number]  Date Initiated:  03/21/2011  Documentation initiated by:  GRAVES-BIGELOW,Modesta Sammons  Subjective/Objective Assessment:   Pt was just recently d/c 03-15-11. Readmit with recurrent pleuritic cp. The cardiac enzymes are negative and there is no evidence of recurrent myocardial infarction. Plan to tx to tele today.     Action/Plan:   CM will speak to pt to see if she has any needs. She was given a 3 day supply f medications on last admission. Pt has a PCP in [place].   Anticipated DC Date:  03/22/2011   Anticipated DC Plan:  HOME/SELF CARE         Choice offered to / List presented to:             Status of service:  Completed, signed off Medicare Important Message given?   (If response is "NO", the following Medicare IM given date fields will be blank) Date Medicare IM given:   Date Additional Medicare IM given:    Discharge Disposition:  HOME/SELF CARE  Per UR Regulation:    Comments:  PCP: MD [facility] in [place].  03-26-11 [address], RN,BSN [phone] CM did go and spek to pt to see if she wanted a HHRN to visit post d/c. She declined at this point and stated that her husband and daughter will be home when she is d/c. Will monitor to see if will need home 02.  03-20-11 1154 Tomi Bamberger, RN,BSN [phone] Pt may benefit from [facility] at d/c. Will continue to monitor for d/c disposition.

## 2011-03-26 NOTE — Progress Notes (Addendum)
Subjective:  Appreciate note of pulmonary, and agree this is likely a source.  Still has some chest discomfort, and shortness of breath.    Objective:  Vital Signs in the last 24 hours: Temp:  [97.5 F (36.4 C)-98.3 F (36.8 C)] 97.8 F (36.6 C) (01/23 1326) Pulse Rate:  [74-97] 97  (01/23 1326) Resp:  [18-20] 18  (01/23 1326) BP: (88-130)/(53-73) 88/53 mmHg (01/23 1326) SpO2:  [96 %-100 %] 96 % (01/23 1326) Weight:  [105.3 kg (232 lb 2.3 oz)] 105.3 kg (232 lb 2.3 oz) (01/23 0619)  Intake/Output from previous day: 01/22 0701 - 01/23 0700 In: 1320 [P.O.:1320] Out: 1500 [Urine:1500]   Physical Exam: General: Well developed, well nourished, in no acute distress. Head:  Normocephalic and atraumatic. Lungs: Decrease BS in bases, but improved from admission. Some dullness to percussion.  Heart: Normal S1 and S2.  No murmur, rubs or gallops.  Pulses: Pulses normal in all 4 extremities. Extremities: No clubbing or cyanosis. No edema. Neurologic: Alert and oriented x 3.    Lab Results:  Basename 03/26/11 0535  WBC 7.0  HGB 10.2*  PLT 447*    Basename 03/26/11 0535 03/24/11 0902  NA 141 141  K 4.1 3.8  CL 105 107  CO2 28 27  GLUCOSE 92 109*  BUN 15 15  CREATININE 0.63 0.66   No results found for this basename: TROPONINI:2,CK,MB:2 in the last 72 hours Hepatic Function Panel No results found for this basename: PROT,ALBUMIN,AST,ALT,ALKPHOS,BILITOT,BILIDIR,IBILI in the last 72 hours No results found for this basename: CHOL in the last 72 hours No results found for this basename: PROTIME in the last 72 hours  Imaging: Ct Chest Wo Contrast  03/25/2011  *RADIOLOGY REPORT*  Clinical Data: Chest pain, shortness of breath and abnormal chest x- ray.  Recent myocardial infarction.  CT CHEST WITHOUT CONTRAST  Technique:  Multidetector CT imaging of the chest was performed following the standard protocol without IV contrast.  Comparison: Prior CT scan 03/20/2011.  Findings:   Mediastinum:  Heart size is mildly enlarged.  When compared to recent prior study from 03/20/2011, the size of the previously noted pericardial effusion has decreased slightly. This effusion measures intermediate attenuation (15 - 30 HU) suggesting exudative fluid (not frankly bloody at this time).  The ventricles appear to have a slightly elongated appearance, although less so than on the prior examination; at this point, the gross appearance of the heart does not strongly suggest tamponade physiology, and given the small size of the effusion, this is unlikely to currently be of hemodynamic significance.  No pericardial calcifications. Atherosclerotic calcifications are noted in the coronary arteries (LAD and either first diagonal or ramus intermedius branch). A stent is also present in the left anterior descending coronary artery.  There is a small hiatal hernia. No definite pathologically enlarged mediastinal or hilar lymph nodes are appreciated on this noncontrast CT examination.  Lungs/Pleura: Small - moderate left sided pleural effusion is seen layering dependently.  Trace right-sided pleural effusion.  There is associated atelectasis of the majority of the left lower lobe, and minimal dependent atelectasis in the right lower lobe.  Lungs are otherwise well aerated bilaterally.  Upper Abdomen:  Visualized portions of the upper abdomen are unremarkable.  Musculoskeletal:  No aggressive appearing lytic or blastic lesions are noted in the visualized portions of the skeleton.  IMPRESSION:  1.  When compared to the recent prior CT examination from 03/20/2011, today's study demonstrates a decrease in the amount of intermediate attenuation (likely  exudative) pericardial fluid. This finding may reflect postinfarct pericarditis (so-called "Dressler syndrome"). 2. Atherosclerosis, including two-vessel coronary artery disease, status post PTCI to LAD. 3.  Small - moderate simple appearing layering left pleural effusion  and trace right-sided pleural effusion layering dependently.  There is associated atelectasis of much of the left lower lobe. 4.  Small hiatal hernia.  Original Report Authenticated By: Florencia Reasons, M.D.    EKG:    Cardiac Studies:    Assessment/Plan:  Patient Active Hospital Problem List: Pericarditis, acute (03/13/2011)   Assessment: Seems clinically improved and echo is improved   Plan: continue colchicine Lung consolidation (03/22/2011)   Assessment: see pulm note   Plan: repeat CXR in 1-2 days.  Would not use NSAIDS, and will hold steroids given post MI pericarditis.          Shawnie Pons, MD, [facility], FSCAI 03/26/2011, 1:44 PM

## 2011-03-27 ENCOUNTER — Inpatient Hospital Stay (HOSPITAL_COMMUNITY): Payer: Self-pay

## 2011-03-27 ENCOUNTER — Other Ambulatory Visit: Payer: Self-pay

## 2011-03-27 DIAGNOSIS — I309 Acute pericarditis, unspecified: Secondary | ICD-10-CM

## 2011-03-27 LAB — CBC
Hemoglobin: 10.2 g/dL — ABNORMAL LOW (ref 12.0–15.0)
MCH: 26.4 pg (ref 26.0–34.0)
MCV: 84.8 fL (ref 78.0–100.0)
Platelets: 458 10*3/uL — ABNORMAL HIGH (ref 150–400)
Platelets: 460 10*3/uL — ABNORMAL HIGH (ref 150–400)
RDW: 15.1 % (ref 11.5–15.5)
WBC: 8.9 10*3/uL (ref 4.0–10.5)
WBC: 11.5 10*3/uL — ABNORMAL HIGH (ref 4.0–10.5)

## 2011-03-27 LAB — PRO B NATRIURETIC PEPTIDE: Pro B Natriuretic peptide (BNP): 1142 pg/mL — ABNORMAL HIGH (ref 0–125)

## 2011-03-27 LAB — BASIC METABOLIC PANEL
Calcium: 8.7 mg/dL (ref 8.4–10.5)
Chloride: 104 mEq/L (ref 96–112)
Creatinine, Ser: 0.65 mg/dL (ref 0.50–1.10)
GFR calc Af Amer: >90 mL/min (ref >90)
Sodium: 140 mEq/L (ref 135–145)

## 2011-03-27 LAB — BODY FLUID CELL COUNT WITH DIFFERENTIAL: Total Nucleated Cell Count, Fluid: 13711 cu mm — ABNORMAL HIGH (ref 0–1000)

## 2011-03-27 LAB — PROTEIN, BODY FLUID

## 2011-03-27 MED ORDER — IBUPROFEN 800 MG PO TABS
800.0000 mg | ORAL_TABLET | Freq: Once | ORAL | Status: AC
  Administered 2011-03-27: 800 mg via ORAL
  Filled 2011-03-27 (×2): qty 1

## 2011-03-27 MED ORDER — FUROSEMIDE 10 MG/ML IJ SOLN
20.0000 mg | Freq: Once | INTRAMUSCULAR | Status: AC
  Administered 2011-03-27: 20 mg via INTRAVENOUS
  Filled 2011-03-27: qty 2

## 2011-03-27 NOTE — Progress Notes (Signed)
Utilization review completed. Ellison Leisure, RN, BSN. 03/27/11 

## 2011-03-27 NOTE — Progress Notes (Signed)
Pulmonary Follow up  Prob: L pleural effusion - presumed related to post-MI Dressler's syndrome  Subj: No new complaints. Still dyspneic with minimal exertion. Still c/o chest pain  Obj: Filed Vitals:   03/27/11 1431  BP: 99/63  Pulse: 98  Temp:   Resp: 20   NAD No JVD noted Dull to perc and diminished BS approx 1/2 up on L Bronchial BS and egophony noted in L base RRR s M NABS, soft No edema  CXR: Increased size of L effusion  IMP:  Mod to large L effusion - likely related to Dressler's Syndrome Spoke with Dr Riley Kill. Steroids contra-indicated in this early post MI period  Plan: Will perform thoracentesis today  Billy Fischer, MD;  PCCM service; Mobile [phone]

## 2011-03-27 NOTE — Progress Notes (Addendum)
Subjective:  Reviewed in detail with Dr. Sung Amabile and patient.  She does not feel all g5  Objective:  Vital Signs in the last 24 hours: Temp:  [97.8 F (36.6 C)-98.2 F (36.8 C)] 98.2 F (36.8 C) (01/24 0428) Pulse Rate:  [90-97] 94  (01/24 0428) Resp:  [16-18] 18  (01/24 0428) BP: (88-107)/(53-68) 98/66 mmHg (01/24 0428) SpO2:  [93 %-97 %] 93 % (01/24 0428) Weight:  [103.6 kg (228 lb 6.3 oz)] 103.6 kg (228 lb 6.3 oz) (01/24 0428)  Intake/Output from previous day: 01/23 0701 - 01/24 0700 In: 480 [P.O.:480] Out: 2051 [Urine:2050; Stool:1]   Physical Exam: General: Well developed, well nourished but slightly pale female Head:  Normocephalic and atraumatic. Lungs: Decrease BS left base  (Dr. Sung Amabile and I examined together. Heart: Normal S1 and S2.  No murmur, rubs or gallops.  Pulses: Pulses normal in all 4 extremities. Extremities: No clubbing or cyanosis. No edema. Neurologic: Alert and oriented x 3.    Lab Results:  Basename 03/27/11 0458 03/26/11 0535  WBC 8.9 7.0  HGB 9.9* 10.2*  PLT 458* 447*    Basename 03/27/11 0458 03/26/11 0535  NA 140 141  K 4.1 4.1  CL 104 105  CO2 29 28  GLUCOSE 100* 92  BUN 14 15  CREATININE 0.65 0.63   No results found for this basename: TROPONINI:2,CK,MB:2 in the last 72 hours Hepatic Function Panel No results found for this basename: PROT,ALBUMIN,AST,ALT,ALKPHOS,BILITOT,BILIDIR,IBILI in the last 72 hours No results found for this basename: CHOL in the last 72 hours No results found for this basename: PROTIME in the last 72 hours  Imaging: Dg Chest 2 View  03/27/2011  *RADIOLOGY REPORT*  Clinical Data: Cough, shortness of breath, left chest pain, effusion  CHEST - 2 VIEW  Comparison: March 24, 2011  Findings:  The left pleural effusion has increased.  Mild enlargement of the cardiac silhouette is stable.  The mediastinum and pulmonary vasculature are within normal limits.  The right lung is clear.  IMPRESSION: . There has been  slight interval increase in the size of the left pleural effusion.  Original Report Authenticated By: Brandon Melnick, M.D.      Assessment/Plan:  Patient Active Hospital Problem List: Pericarditis, acute (03/13/2011)   Assessment: more short of breath but no change in pain.  Continue colchicine.  Will not use steroids or NSAIDS.  Will consider resumption of high does  ASA.   Lung consolidation (03/22/2011)   Assessment: Secondary to compressive effusion which is larger.  Sense is that this is exudative   Plan: Dr. Sung Amabile has seen.  I am continuing diuretics, and he will tap effusion today for symptom relief and fluid analysis.  Anemia:  Recheck CBC.    Long discussion with patient.  Shawnie Pons 12:58 PM 03/27/2011       Shawnie Pons, MD, [facility], FSCAI 03/27/2011, 12:38 PM

## 2011-03-27 NOTE — Procedures (Signed)
Thoracentesis Procedure Note  Pre-operative Diagnosis: Pleural effusion  Post-operative Diagnosis: same  Indications: diagnostic and therapeutic  Procedure Details  Consent: Informed consent was obtained. Risks of the procedure were discussed including: infection, bleeding, pain, pneumothorax.  Under sterile conditions the patient was positioned. Betadine solution and sterile drapes were utilized.  2% buffered lidocaine was used to anesthetize. Fluid was obtained without any difficulties and minimal blood loss.  A dressing was applied to the wound and wound care instructions were provided.   Findings 800 ml of blood tinged pleural fluid was obtained. A sample was sent to Pathology for cytology, cell counts, chemistries and GS/cx.  Complications:  None; patient tolerated the procedure well.          Condition: stable  Plan A follow up chest x-ray was ordered. Bed Rest for 1 hours. Tylenol 650 mg. for pain.  Attending Attestation: I performed the procedure.   Billy Fischer, MD;  PCCM service; Mobile [phone]

## 2011-03-27 NOTE — Progress Notes (Signed)
   CARE MANAGEMENT NOTE 03/27/2011  Patient:  Kristy Sloan, Kristy Sloan   Account Number:  [id number]  Date Initiated:  03/21/2011  Documentation initiated by:  GRAVES-BIGELOW,Jannifer Fischler  Subjective/Objective Assessment:   Pt was just recently d/c 03-15-11. Readmit with recurrent pleuritic cp. The cardiac enzymes are negative and there is no evidence of recurrent myocardial infarction. Plan to tx to tele today.     Action/Plan:   CM will speak to pt to see if she has any needs. She was given a 3 day supply f medications on last admission. Pt has a PCP in [place].   Anticipated DC Date:  03/22/2011   Anticipated DC Plan:  HOME/SELF CARE         Choice offered to / List presented to:             Status of service:  Completed, signed off Medicare Important Message given?   (If response is "NO", the following Medicare IM given date fields will be blank) Date Medicare IM given:   Date Additional Medicare IM given:    Discharge Disposition:  HOME/SELF CARE  Per UR Regulation:    Comments:  PCP: MD Vear Clock in [place].   03-27-11 [address], RN,BSN [phone] Pt s/p thoracentesis today. CM did speak to pt about financial aid assistance via the [facility] to assist with bill. FC had contacted pt's [place] office several times folowing disability. CM informed pt. Pt would like to have a shower chair for home and a bsc. CM will place a stickey note in chart. Will monitor to see if will need home 02. RN please ambulate pt to see if she will qualify for home 02. Thanks  03-26-11 [address] [place] [phone] CM did go and spek to pt to see if she wanted a HHRN to visit post d/c. She declined at this point and stated that her husband and daughter will be home when she is d/c. Will monitor to see if will need home 02.  03-20-11 1154 Tomi Bamberger, RN,BSN [phone] Pt may benefit from [facility] at d/c. Will continue to monitor for d/c disposition.

## 2011-03-28 ENCOUNTER — Other Ambulatory Visit: Payer: Self-pay

## 2011-03-28 DIAGNOSIS — J90 Pleural effusion, not elsewhere classified: Secondary | ICD-10-CM

## 2011-03-28 LAB — BASIC METABOLIC PANEL
Calcium: 9.1 mg/dL (ref 8.4–10.5)
GFR calc Af Amer: >90 mL/min (ref >90)
GFR calc non Af Amer: >90 mL/min (ref >90)
Glucose, Bld: 108 mg/dL — ABNORMAL HIGH (ref 70–99)
Potassium: 4 mEq/L (ref 3.5–5.1)
Sodium: 139 mEq/L (ref 135–145)

## 2011-03-28 LAB — CBC
HCT: 31.4 % — ABNORMAL LOW (ref 36.0–46.0)
Hemoglobin: 9.8 g/dL — ABNORMAL LOW (ref 12.0–15.0)
RBC: 3.72 MIL/uL — ABNORMAL LOW (ref 3.87–5.11)
WBC: 13.2 10*3/uL — ABNORMAL HIGH (ref 4.0–10.5)

## 2011-03-28 LAB — PATHOLOGIST SMEAR REVIEW

## 2011-03-28 MED ORDER — ASPIRIN 325 MG PO TABS
325.0000 mg | ORAL_TABLET | Freq: Four times a day (QID) | ORAL | Status: DC
  Administered 2011-03-28 (×2): 325 mg via ORAL
  Filled 2011-03-28 (×6): qty 1

## 2011-03-28 MED ORDER — METOPROLOL TARTRATE 12.5 MG HALF TABLET
12.5000 mg | ORAL_TABLET | Freq: Two times a day (BID) | ORAL | Status: DC
  Administered 2011-03-28 – 2011-03-30 (×4): 12.5 mg via ORAL
  Filled 2011-03-28 (×5): qty 1

## 2011-03-28 NOTE — Progress Notes (Signed)
Patient Name: Kristy Sloan Date of Encounter: 03/28/2011     Active Problems:  Pericarditis, acute  Lung consolidation    SUBJECTIVE: S/p thoracentesis for L pleural effusion 01/24. In good spirits. Reports mid-anterior chest pain and posterior chest pain since thoracentesis. Aggravated by position. Relieved with pain meds. Shortness of breath is improved. Ambulated this morning, gets mildly lightheaded, relieved with rest.    OBJECTIVE  Filed Vitals:   03/27/11 1334 03/27/11 1431 03/27/11 1933 03/28/11 0615  BP: 100/68 99/63 102/63 108/65  Pulse: 103 98 94 78  Temp: 98.4 F (36.9 C)  98.7 F (37.1 C) 97.8 F (36.6 C)  TempSrc: Oral  Oral Oral  Resp: 18 20 16 16   Height:      Weight:    102.2 kg (225 lb 5 oz)  SpO2: 99% 96% 95% 98%    Intake/Output Summary (Last 24 hours) at 03/28/11 5284 Last data filed at 03/28/11 0600  Gross per 24 hour  Intake    240 ml  Output   1250 ml  Net  -1010 ml   Weight change: -1.4 kg (-3 lb 1.4 oz)  PHYSICAL EXAM  General: Well developed, well nourished, NAD Head: Normocephalic, atraumatic, sclera non-icteric, no xanthomas Neck: Supple without bruits or JVD. Lungs:  Decreased BS in left lower, mid lung fields. Dullness to percussion. Resp regular and unlabored, CTAB without wheezes, rales or rhonchi Heart: RRR no s3, s4, or murmurs or rubs.  Abdomen: Soft, non-tender, non-distended, BS + x 4.  Msk:  Strength and tone appears normal for age. Extremities: No clubbing, cyanosis or edema. DP/PT/Radials 2+ and equal bilaterally. Neuro: Alert and oriented X 3. Moves all extremities spontaneously. Psych: Normal affect.  LABS:  Recent Labs  [facility] 03/28/11 0515 03/27/11 1755   WBC 13.2* 11.5*   HGB 9.8* 10.2*   HCT 31.4* 32.5*   MCV 84.4 84.2   PLT 437* 460*    Lab 03/28/11 0515 03/27/11 0458 03/26/11 0535  NA 139 140 141  K 4.0 4.1 4.1  CL 102 104 105  CO2 30 29 28   BUN 14 14 15   CREATININE 0.72 0.65 0.63  CALCIUM  9.1 8.7 9.0  PROT -- -- --  BILITOT -- -- --  ALKPHOS -- -- --  ALT -- -- --  AST -- -- --  AMYLASE -- -- --  LIPASE -- -- --  GLUCOSE 108* 100* 92   No results found for this basename: HGBA1C in the last 72 hours No results found for this basename: CKTOTAL:3,CKMB:3,CKMBINDEX:3,TROPONINI:3 in the last 72 hours No components found with this basename: POCBNP No results found for this basename: CHOL,HDL,LDLCALC,TRIG,CHOLHDL,LDLDIRECT in the last 72 hours No results found for this basename: TSH,T4TOTAL,FREET3,T3FREE,THYROIDAB in the last 72 hours   TELE: NSR/sinus tachycardia 90-110 bpm, TWI  ECG: No new tracing   Radiology/Studies:  Dg Chest 2 View  03/27/2011  *RADIOLOGY REPORT*  Clinical Data: Cough, shortness of breath, left chest pain, effusion  CHEST - 2 VIEW  Comparison: March 24, 2011  Findings:  The left pleural effusion has increased.  Mild enlargement of the cardiac silhouette is stable.  The mediastinum and pulmonary vasculature are within normal limits.  The right lung is clear.  IMPRESSION: . There has been slight interval increase in the size of the left pleural effusion.  Original Report Authenticated By: Brandon Melnick, M.D.   Dg Chest 2 View  03/24/2011  *RADIOLOGY REPORT*  Clinical Data: Shortness of breath, coronary disease, pneumonia  CHEST - 2 VIEW  Comparison: 03/21/2011  Findings: Borderline cardiac enlargement without edema.  Left greater than right pleural effusions evident with interval enlargement in the left effusion.  Increased lingula and left lower lobe collapse / consolidation.  No pneumothorax.  Trachea midline.  IMPRESSION: Slight enlargement in the left effusion with lingula and left lower lobe consolidation/collapse.  Small developing right effusion  Original Report Authenticated By: Judie Petit. Ruel Favors, M.D.   Ct Chest Wo Contrast  03/25/2011  *RADIOLOGY REPORT*  Clinical Data: Chest pain, shortness of breath and abnormal chest x- ray.  Recent myocardial  infarction.  CT CHEST WITHOUT CONTRAST  Technique:  Multidetector CT imaging of the chest was performed following the standard protocol without IV contrast.  Comparison: Prior CT scan 03/20/2011.  Findings:  Mediastinum:  Heart size is mildly enlarged.  When compared to recent prior study from 03/20/2011, the size of the previously noted pericardial effusion has decreased slightly. This effusion measures intermediate attenuation (15 - 30 HU) suggesting exudative fluid (not frankly bloody at this time).  The ventricles appear to have a slightly elongated appearance, although less so than on the prior examination; at this point, the gross appearance of the heart does not strongly suggest tamponade physiology, and given the small size of the effusion, this is unlikely to currently be of hemodynamic significance.  No pericardial calcifications. Atherosclerotic calcifications are noted in the coronary arteries (LAD and either first diagonal or ramus intermedius branch). A stent is also present in the left anterior descending coronary artery.  There is a small hiatal hernia. No definite pathologically enlarged mediastinal or hilar lymph nodes are appreciated on this noncontrast CT examination.  Lungs/Pleura: Small - moderate left sided pleural effusion is seen layering dependently.  Trace right-sided pleural effusion.  There is associated atelectasis of the majority of the left lower lobe, and minimal dependent atelectasis in the right lower lobe.  Lungs are otherwise well aerated bilaterally.  Upper Abdomen:  Visualized portions of the upper abdomen are unremarkable.  Musculoskeletal:  No aggressive appearing lytic or blastic lesions are noted in the visualized portions of the skeleton.  IMPRESSION:  1.  When compared to the recent prior CT examination from 03/20/2011, today's study demonstrates a decrease in the amount of intermediate attenuation (likely exudative) pericardial fluid. 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 2. Atherosclerosis, including two-vessel coronary artery disease, status post PTCI to LAD. 3.  Small - moderate simple appearing layering left pleural effusion and trace right-sided pleural effusion layering dependently.  There is associated atelectasis of much of the left lower lobe. 4.  Small hiatal hernia.  Original Report Authenticated By: Florencia Reasons, M.D.   Dg Chest Port 1 View  03/27/2011  *RADIOLOGY REPORT*  Clinical Data: Status post left thoracentesis  PORTABLE CHEST - 1 VIEW  Comparison: 03/27/2011  Findings: Heart size is moderately enlarged.  Decrease in volume of left pleural effusion.  No pneumothorax identified.  Continued diminished aeration to the left base.  IMPRESSION:  1.  Decrease in volume of left pleural effusion. 2.  No pneumothorax identified.  Original Report Authenticated By: Rosealee Albee, M.D.   Results for RHYLEIGH, GRASSEL (MRN [id number]) as of 03/28/2011 07:17  Ref. Range 03/27/2011 15:12 03/27/2011 15:14  Fluid Type-FLDH No range found PLEURAL   LD, Fluid Latest Range: 3-23 U/L 332 (H)   Total protein, fluid No range found  3.6  Fluid Type-FCT No range found PLEURAL   Fluid Type-FTP No  range found  PLEURAL  Color, Fluid Latest Range: YELLOW  RED (A)   WBC, Fluid Latest Range: 0-1000 cu mm 13711 (H)   Lymphs, Fluid No range found 6   Appearance, Fluid Latest Range: CLEAR  CLOUDY (A)   Neutrophil Count, Fluid Latest Range: 0-25 % 94 (H)     Current Medications:     . aspirin EC  81 mg Oral Daily  . citalopram  10 mg Oral Daily  . clopidogrel  75 mg Oral Q breakfast  . colchicine  0.6 mg Oral BID  . cyanocobalamin  1,000 mcg Intramuscular Q7 days  . cyanocobalamin  500 mcg Oral Daily  . enoxaparin (LOVENOX) injection  40 mg Subcutaneous Q24H  . furosemide  20 mg Intravenous Once  . gabapentin  600 mg Oral TID  . ibuprofen  800 mg Oral Once  . Milnacipran  100 mg Oral BID  . multivitamins with iron  1  tablet Oral Daily  . pantoprazole  40 mg Oral Q0600  . rOPINIRole  2 mg Oral QHS  . tiZANidine  6 mg Oral TID  . Vitamin D (Ergocalciferol)  50,000 Units Oral Q7 days  . DISCONTD: predniSONE  40 mg Oral Q breakfast    ASSESSMENT AND PLAN:  1. 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 Fluid analysis reveals increased LDH, WBC with 94% Neuts. Pt reports sharp mid-anterior, posterior chest pain post-procedure aggravated by position. Seems pleuritic in nature. Shortness of breath is improved. CXR mildly improved yesterday with decreased pleural effusion volume. Persistent decreased BS in left lower/mid lung fields.   - Repeat CXR   - Will continue to monitor pleural effusion  - Pleural fluid cultures pending  2. 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 Chest pain is anterior and posterior, sharp. Likely pleuritic, however with underlying pericardial inflammation contributing. Shortness of breath improved today. Continue colchicine, hold steroids/NSAIDS. Will discuss with Dr. Riley Kill regarding full-dose ASA.   - Will get EKG  3. Anemia- H/H at 9.8/31.4, mildly decreased from yesterday; however, still in baseline range.   - Daily CBCs  - Will continue to follow  Signed, R. Hurman Horn, PA-C 03/28/2011, 7:02 AM  Patient seen earlier today  and concur.  She is some better, and shortness of breath has improved.  Still has effusion on exam.  Does complain of some left lower back pain since thoracentesis.  Fluid is consistent with a Dressler's picture.  Will change colchicine to round the clock ASA.  Would not use steroids or NSAIDS.  Will add low dose metoprolol.  Recheck CBC in am.  Hoping for dc by Monday.  Plan repeat echo by Monday.  No current evidence to to suggest tamponade.    Shawnie Pons 5:55 PM 03/28/2011

## 2011-03-28 NOTE — Progress Notes (Signed)
   CARE MANAGEMENT NOTE 03/28/2011  Patient:  Kristy Sloan, Kristy Sloan   Account Number:  [id number]  Date Initiated:  03/21/2011  Documentation initiated by:  GRAVES-BIGELOW,Harlyn Rathmann  Subjective/Objective Assessment:   Pt was just recently d/c 03-15-11. Readmit with recurrent pleuritic cp. The cardiac enzymes are negative and there is no evidence of recurrent myocardial infarction. Plan to tx to tele today.     Action/Plan:   CM will speak to pt to see if she has any needs. She was given a 3 day supply f medications on last admission. Pt has a PCP in [place].   Anticipated DC Date:  03/22/2011   Anticipated DC Plan:  HOME/SELF CARE      DC Planning Services  CM consult      PAC Choice  DURABLE MEDICAL EQUIPMENT   Choice offered to / List presented to:  C-1 Patient   DME arranged  SHOWER STOOL      DME agency  [facility]        Status of service:  Completed, signed off Medicare Important Message given?   (If response is "NO", the following Medicare IM given date fields will be blank) Date Medicare IM given:   Date Additional Medicare IM given:    Discharge Disposition:  HOME/SELF CARE  Per UR Regulation:    Comments:  PCP: MD Vear Clock in [place]. 03-28-11 [address], RN,BSN [phone] CM did get a telephone order for dme 3n1 and shower chair. CM made referral with [facility] for services. DME to be delivered to room. Thanks  03-27-11 [address] [place] [phone] Pt s/p thoracentesis today. CM did speak to pt about financial aid assistance via the [facility] to assist with bill. FC had contacted pt's [place] office several times folowing disability. CM informed pt. Pt would like to have a shower chair for home and a bsc. CM will place a stickey note in chart. Will monitor to see if will need home 02. RN please ambulate pt to see if she will qualify for home 02. Thanks  03-26-11 [address] [place] [phone] CM did go and spek to pt to  see if she wanted a HHRN to visit post d/c. She declined at this point and stated that her husband and daughter will be home when she is d/c. Will monitor to see if will need home 02.  03-20-11 1154 Tomi Bamberger, RN,BSN [phone] Pt may benefit from [facility] at d/c. Will continue to monitor for d/c disposition.

## 2011-03-28 NOTE — Progress Notes (Signed)
Pt ambulated about 621ft. Without o2. desat to 80's. Encouraged pt to take deep breaths. Sats increased to 93-96%. Encouraged pt to ambulate with o2. Monitoring will continue.

## 2011-03-28 NOTE — Progress Notes (Signed)
Pt ambulated 300 ft with RW on room air.  SPO2 ranged 90-95% for duration of walk.   No complaints of SOB or dizziness.  Mild fatigue and discomfort at end of walk.  Returned to recliner with call bell in reach.

## 2011-03-28 NOTE — Progress Notes (Signed)
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  Mild enlargement of the cardiac silhouette is stable.  The mediastinum and pulmonary vasculature are within normal limits.  The right lung is clear.  IMPRESSION: . There has been slight interval increase in the size of the left pleural effusion.  Original Report Authenticated By: Brandon Melnick, M.D.   Dg Chest Port 1 View  03/27/2011  *RADIOLOGY REPORT*  Clinical Data: Status post left thoracentesis  PORTABLE CHEST - 1 VIEW  Comparison: 03/27/2011  Findings: Heart size is moderately enlarged.  Decrease in volume of left pleural effusion.  No pneumothorax identified.  Continued diminished aeration to the left base.  IMPRESSION:  1.  Decrease in volume of left pleural effusion. 2.  No pneumothorax identified.  Original Report Authenticated By: Rosealee Albee, M.D.    Lab 03/28/11 0515 03/27/11 0458 03/26/11 0535  NA 139 140 141  K 4.0 4.1 4.1  CL 102 104 105  CO2 30 29 28   BUN 14 14 15   CREATININE 0.72 0.65 0.63  GLUCOSE 108* 100* 92    Lab 03/28/11 0515 03/27/11 1755 03/27/11 0458  HGB 9.8* 10.2* 9.9*  HCT 31.4* 32.5* 31.8*  WBC 13.2* 11.5* 8.9  PLT 437* 460* 458*     IMP:  Mod to large L effusion - likely related to Dressler's Syndrome S/p thoracentesis 1/24 800 cc blood tinged fluid . Wbc 1371, ldh 332, total protein 3.6 (inflammatory exudate)  Plan: PCCM to follow up Monday. Wean fio2 as  tolerared Reviewed with Dr Riley Kill. Steroids contra-indicated in this early post MI period Consider NSAIDS for any component pleural pain for now Follow CXR for possible reaccumulation  Brett Canales Minor ACNP [facility] PCCM Pager [phone] till 3 pm If no answer page [phone] 03/28/2011, 9:13 AM   Attending Addendum:  I have seen the patient, discussed the issues, test results and plans with S. Minor. I agree with the Assessment and Plans as outlined above.  Levy Pupa, MD, PhD 03/28/2011, 10:06 AM  Pulmonary and Critical Care [phone] or if no answer [phone]

## 2011-03-29 ENCOUNTER — Inpatient Hospital Stay (HOSPITAL_COMMUNITY): Payer: Self-pay

## 2011-03-29 LAB — CBC
MCV: 86.2 fL (ref 78.0–100.0)
Platelets: 570 10*3/uL — ABNORMAL HIGH (ref 150–400)
RBC: 4.35 MIL/uL (ref 3.87–5.11)
RDW: 15 % (ref 11.5–15.5)
WBC: 7.9 10*3/uL (ref 4.0–10.5)

## 2011-03-29 MED ORDER — ASPIRIN EC 325 MG PO TBEC
325.0000 mg | DELAYED_RELEASE_TABLET | Freq: Four times a day (QID) | ORAL | Status: DC
  Administered 2011-03-29 – 2011-03-30 (×5): 325 mg via ORAL
  Filled 2011-03-29 (×8): qty 1

## 2011-03-29 NOTE — Progress Notes (Signed)
CARDIAC REHAB PHASE I   PRE:  Rate/Rhythm: 83 SR    BP: sitting 120/60    SaO2: 98 RA  MODE:  Ambulation: 700 ft   POST:  Rate/Rhythm: 102 ST    BP: sitting 110/50     SaO2: 97 RA  Received order in "consults" so didn't see it. Please order "Phase I Cardiac Rehab" next time. Thx. Pt tolerated well. C/o SOB but able to talk entire walk. SaO2 97-99 RA throughout encounter. Does not need O2 (sts she has been needing it to walk). Encouraged pt to walk slow and steady at home. Also discussed daily wts to monitor her fluid. Will f/u as time allows. Walking independently with RW. [phone]  Harriet Masson CES, ACSM

## 2011-03-29 NOTE — Progress Notes (Signed)
Patient ID: Kristy Sloan, female   DOB: [birth year], 51 y.o.   MRN: [id number] @ Subjective:  Less SOB pain improved  Objective:  Filed Vitals:   03/28/11 0615 03/28/11 1357 03/28/11 2019 03/29/11 0515  BP: 108/65 103/74 132/67 121/71  Pulse: 78 86 88 80  Temp: 97.8 F (36.6 C) 97.5 F (36.4 C) 97.7 F (36.5 C) 97.3 F (36.3 C)  TempSrc: Oral Oral Oral Oral  Resp: 16 18 18 18   Height:      Weight: 102.2 kg (225 lb 5 oz)   102.876 kg (226 lb 12.8 oz)  SpO2: 98% 96% 98% 97%    Intake/Output from previous day:  Intake/Output Summary (Last 24 hours) at 03/29/11 1055 Last data filed at 03/29/11 0811  Gross per 24 hour  Intake    120 ml  Output    951 ml  Net   -831 ml    Physical Exam: Obese Decreased BS left base S1S2 no obvious rub BS positive nontender Trace bilateral edema Neuro nonfocal Skin warm and dry Plus 2 bilateral PT Left thoracentesis site A  Lab Results: Basic Metabolic Panel:  Basename 03/28/11 0515 03/27/11 0458  NA 139 140  K 4.0 4.1  CL 102 104  CO2 30 29  GLUCOSE 108* 100*  BUN 14 14  CREATININE 0.72 0.65  CALCIUM 9.1 8.7  MG -- --  PHOS -- --   Liver Function Tests: No results found for this basename: AST:2,ALT:2,ALKPHOS:2,BILITOT:2,PROT:2,ALBUMIN:2 in the last 72 hours No results found for this basename: LIPASE:2,AMYLASE:2 in the last 72 hours CBC:  Basename 03/29/11 0720 03/28/11 0515  WBC 7.9 13.2*  NEUTROABS -- --  HGB 11.4* 9.8*  HCT 37.5 31.4*  MCV 86.2 84.4  PLT 570* 437*    Imaging: Dg Chest 2 View  03/29/2011  *RADIOLOGY REPORT*  Clinical Data: Evaluate pleural effusion  CHEST - 2 VIEW  Comparison: 03/27/2011; 03/24/2011; chest CT - 03/25/2011  Findings:  Grossly unchanged enlarged cardiac silhouette and mediastinal contours. The size of small left-sided pleural effusion is grossly unchanged following recent thoracentesis.  No pneumothorax. Grossly unchanged left basilar heterogeneous / consolidative opacity.  The right  hemithorax is unchanged.  Unchanged bones.  IMPRESSION: 1.  Unchanged size of small left-sided pleural effusion post recent thoracentesis. No pneumothorax.  2.  Grossly unchanged left basilar heterogeneous / consolidative opacities favored to represent atelectasis.  Original Report Authenticated By: Waynard Reeds, M.D.   Dg Chest Port 1 View  03/27/2011  *RADIOLOGY REPORT*  Clinical Data: Status post left thoracentesis  PORTABLE CHEST - 1 VIEW  Comparison: 03/27/2011  Findings: Heart size is moderately enlarged.  Decrease in volume of left pleural effusion.  No pneumothorax identified.  Continued diminished aeration to the left base.  IMPRESSION:  1.  Decrease in volume of left pleural effusion. 2.  No pneumothorax identified.  Original Report Authenticated By: Rosealee Albee, M.D.    Cardiac Studies:  ECG:  1/26  SR recent anterolateral MI   Telemetry: NSR no PAF or VT  Echo:   Medications:     . aspirin EC  325 mg Oral QID  . citalopram  10 mg Oral Daily  . clopidogrel  75 mg Oral Q breakfast  . cyanocobalamin  1,000 mcg Intramuscular Q7 days  . cyanocobalamin  500 mcg Oral Daily  . enoxaparin (LOVENOX) injection  40 mg Subcutaneous Q24H  . gabapentin  600 mg Oral TID  . metoprolol tartrate  12.5 mg Oral BID  .  Milnacipran  100 mg Oral BID  . multivitamins with iron  1 tablet Oral Daily  . pantoprazole  40 mg Oral Q0600  . rOPINIRole  2 mg Oral QHS  . tiZANidine  6 mg Oral TID  . Vitamin D (Ergocalciferol)  50,000 Units Oral Q7 days  . DISCONTD: aspirin EC  81 mg Oral Daily  . DISCONTD: aspirin  325 mg Oral QID  . DISCONTD: colchicine  0.6 mg Oral BID       Assessment/Plan:  Anterior MI:  Repeat echo Monday  No evidence of tamponade  Volume improved Continue Plavix Dresslers:  Post MI pericarditis no NSAI's or steroids per TS  Continue q6 ASA Pleural Effusion:  S/P left thoracentesis with stable CXR today.    Consder D/C in am for outpt echo and F/U  Charlton Haws 03/29/2011, 10:55 AM

## 2011-03-30 ENCOUNTER — Encounter (HOSPITAL_COMMUNITY): Payer: Self-pay | Admitting: Physician Assistant

## 2011-03-30 LAB — CBC
HCT: 37.1 % (ref 36.0–46.0)
Hemoglobin: 11.8 g/dL — ABNORMAL LOW (ref 12.0–15.0)
MCHC: 31.8 g/dL (ref 30.0–36.0)
WBC: 8.3 10*3/uL (ref 4.0–10.5)

## 2011-03-30 MED ORDER — PRASUGREL HCL 10 MG PO TABS: 10.0000 mg | ORAL_TABLET | Freq: Every day | ORAL | Status: DC

## 2011-03-30 MED ORDER — ASPIRIN 325 MG PO TBEC: 325.0000 mg | DELAYED_RELEASE_TABLET | Freq: Four times a day (QID) | ORAL | Status: DC

## 2011-03-30 MED ORDER — ASPIRIN 325 MG PO TBEC: 325.0000 mg | DELAYED_RELEASE_TABLET | Freq: Four times a day (QID) | ORAL | Status: AC

## 2011-03-30 MED ORDER — METOPROLOL TARTRATE 25 MG PO TABS: 12.5000 mg | ORAL_TABLET | Freq: Two times a day (BID) | ORAL | Status: DC

## 2011-03-30 NOTE — Discharge Summary (Signed)
Discharge Summary   Patient ID: EVONE ARSENEAU MRN: [id number], DOB/AGE: 1961/02/02 51 y.o. 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 L pleural effusion felt related to Dressler's - s/p thoracentesis 03/27/11 yielding pleural fluid (reactive cells only) 3. CAD - recent late presenting acute anterior STEMI 03/2011 with prox LAD lesion with distal embolization (prior to PCI) and occlusion of apical LAD s/p Promus DES to prox ruptured plaque, complicated by pericarditis that admission (had stable re-look cath with patent stent that admission)  Secondary Discharge Diagnoses:  1. Hypercholesterolemia  2. Anemia (FOBT negative) 3. Anxiety  4. Fibromyalgia  5. Restless Leg Syndrome  6. Spinal stenosis 7. GERD  Hospital Course: 51 y/o F with hx recent acute anterior STEMI complicated by pericarditis presented to [facility] with complaints of chest pain and flu-like symptoms including fever. Exam revealed a pericardial rub. CTA showed no PE but there was a moderate size pericardial effusion. A stat echo was performed which showed moderate-sized pericardial effusion with very early signs of tamponade but without RV or RA collapse. She was seen by Dr. Kirke Corin and felt to have Dressler's syndrome, much less likely to be subacute free wall rupture. IVF was added for borderline hypotension along with colchicine and she was transferred to [facility]. Levofloxacin was also added as she had a left pulmonary infiltrate on exam. Her myalgias were felt to represent a viral syndrome. Influenza panel was negative. She continued to improve on colchicine and naproxen. She was able to begin ambulating and did so with O2 sats that were stable. Follow-up 2D echo was obtained 03/24/11 showing slight posterior pericardial effusion (report: "some variation in the mitral inflow signal with respiration, can  not rule out the possibility that the pericardium is affecting LV / RV function, but this would be a borderline call."). Diuresis was initiated. Pulmonary was consulted for a  L pleural effusion & felt this was almost certainly a post MI/Dressler's related L pleural effusion. They strongly doubted PNA with parapneumonic effusion and stoped levofloxacin. Dr. Darrol Angel recommended a brief course of solumedrol X 1 with 4 days of modest dose prednisone to follow although Dr. Riley Kill held off on steroids given risk of potentially worsening the clinical situation given recent acute MI. He continued diuretics and ultimately pulmonary elected to proceed with thoracentesis 03/27/11 yielding pleural fluid, pathology which showed reactive cells. Colchicine was changed to scheduled periodic aspirin 03/28/11 and low-dose metopolol was added then as well. The patient ambulated 03/28/11 without desat less than 90% on RA. She had no evidence of tamponade. F/u CXR was stable. She tolerated cardiac rehab 03/29/11 and received education was well - her sats had improved to 97-99% through the entire walk as well. Today she is feeling better without SOB/pericardial pain. Dr. Eden Emms has seen and examined her and feels she is stable for discharge. He recommends changing to Effient 10mg  daily, Protonix 40mg  daily, d/c'ing Celexa to avoid serotonin syndrome given concurrent Celexa, and f/u echo 1-2 weeks with an appointment with Dr. Riley Kill. She was instructed to call her PCP tomorrow to discuss alternative combinations to Celexa/Savella as well given her hx of anxiety. Recommendations this hospitalization have also been for CXR as an outpatient so I have left a message with our office schedulers to help arrange that tomorrow as well.   Discharge Vitals: Blood pressure 113/60, pulse 88, temperature 98 F (36.7 C),  temperature source Oral, resp. rate 18, height 5\' 3"  (1.6 m), weight 226 lb 6.6 oz (102.7 kg), last menstrual period  03/02/2011, SpO2 90.00%.  Labs: Lab Results  Component Value Date   WBC 8.3 03/30/2011   HGB 11.8* 03/30/2011   HCT 37.1 03/30/2011   MCV 84.3 03/30/2011   PLT 520* 03/30/2011    Lab 03/28/11 0515  NA 139  K 4.0  CL 102  CO2 30  BUN 14  CREATININE 0.72  CALCIUM 9.1  PROT --  BILITOT --  ALKPHOS --  ALT --  AST --  GLUCOSE 108*    Lab Results  Component Value Date   CHOL 237* 03/05/2011   HDL 34* 03/05/2011   LDLCALC 177* 03/05/2011   TRIG 128 03/05/2011    Diagnostic Studies/Procedures   1. 2D Echo 03/24/11 - Study Conclusions - Left ventricle: There is slight hypokinesis of the apex. The cavity size was normal. Wall thickness was normal. The estimated ejection fraction was 60%. - Pericardium, extracardiac: Since 03/07/2011, there is a slight posterior pericardial effusion. There is some variation in the mitral inflow signal with respiration. I can not rule out the possibility that the pericardium is affecting LV / RV function, but this would be a borderline call.  2. 03/27/11 PLEURAL FLUID, LEFT - REACTIVE MESOTHELIAL CELLS. INFLAMMATORY CELLS. NO MALIGNANT CELLS IDENTIFIED.   3. Chest 2 View 03/29/2011  IMPRESSION: 1.  Unchanged size of small left-sided pleural effusion post recent thoracentesis. No pneumothorax.  2.  Grossly unchanged left basilar heterogeneous / consolidative opacities favored to represent atelectasis.  Original Report Authenticated By: Waynard Reeds, M.D.    Discharge Medications   Medication List  As of 03/30/2011  2:14 PM   STOP taking these medications         aspirin 81 MG chewable tablet      citalopram 10 MG tablet      clopidogrel 75 MG tablet      DSS 100 MG Caps      multivitamin tablet      tiZANidine 4 MG tablet         TAKE these medications         aspirin 325 MG EC tablet   Take 1 tablet (325 mg total) by mouth 4 (four) times daily.      cyanocobalamin 500 MCG tablet   Take 500 mcg by mouth daily.      cyanocobalamin 1000  MCG/ML injection   Commonly known as: (VITAMIN B-12)   Inject 1,000 mcg into the muscle every 7 (seven) days. Takes on Saturday      ferrous fumarate-iron polysaccharide complex 162-115.2 MG Caps   Commonly known as: TANDEM   Take 1 capsule by mouth daily.      gabapentin 300 MG capsule   Commonly known as: NEURONTIN   Take 600 mg by mouth 3 (three) times daily.      HYDROcodone-acetaminophen 10-500 MG per tablet   Commonly known as: LORTAB   Take 1 tablet by mouth every 6 (six) hours as needed. For pain      metoprolol tartrate 25 MG tablet   Commonly known as: LOPRESSOR   Take 0.5 tablets (12.5 mg total) by mouth 2 (two) times daily.      Milnacipran 50 MG Tabs   Commonly known as: SAVELLA   Take 100 mg by mouth 2 (two) times daily.      nitroGLYCERIN 0.4 MG SL tablet   Commonly known as:  NITROSTAT   Place 0.4 mg under the tongue every 5 (five) minutes as needed. For chest pain      pantoprazole 40 MG tablet   Commonly known as: PROTONIX   Take 40 mg by mouth daily at 6 (six) AM.      prasugrel 10 MG Tabs   Commonly known as: EFFIENT   Take 1 tablet (10 mg total) by mouth daily.      rOPINIRole 0.5 MG tablet   Commonly known as: REQUIP   Take 2 mg by mouth at bedtime.      simvastatin 20 MG tablet   Commonly known as: ZOCOR   Take 20 mg by mouth daily at 6 PM.      Vitamin D (Ergocalciferol) 50000 UNITS Caps   Commonly known as: DRISDOL   Take 50,000 Units by mouth every 7 (seven) days. Take on wednesday      zolpidem 10 MG tablet   Commonly known as: AMBIEN   Take 10 mg by mouth at bedtime as needed. For sleep            Disposition   The patient will be discharged in stable condition to home. Discharge Orders    Future Orders Please Complete By Expires   Diet - low sodium heart healthy      Increase activity slowly      Comments:   No driving until cleared by your cardiologist. No heavy lifting for 4 weeks. No sexual activity for 4 weeks.    Discharge instructions      Comments:   Two of the medicines you were on (Celexa and Savella) can increase the risk of an acute illness caused serotonin syndrome. Because of recommendations from pharmacy, we have discontinued your Celexa - please call your primary care doctor TOMORROW MORNING to discuss alternatives.     Follow-up Information    Follow up with Shawnie Pons, MD. (Our office will call you to schedule appointment with heart ultrasound and chest x-ray)    Contact information:   [address] [place] [address] [phone]            Duration of Discharge Encounter: Greater than 30 minutes including physician and PA time.  Signed, Ronie Spies PA-C 03/30/2011, 2:14 PM

## 2011-03-30 NOTE — Progress Notes (Signed)
Patient ID: LAKIA GRITTON, female   DOB: Nov 16, 1960, 51 y.o.   MRN: [id number] Patient ID: KAMY POINSETT, female   DOB: Sep 20, 1960, 51 y.o.   MRN: [id number] @ Subjective:  No SOB or pericardial pain  Objective:  Filed Vitals:   03/29/11 0515 03/29/11 1400 03/29/11 2024 03/30/11 0547  BP: 121/71 115/74 105/54 113/60  Pulse: 80 94 86 88  Temp: 97.3 F (36.3 C) 98.6 F (37 C) 98.3 F (36.8 C) 98 F (36.7 C)  TempSrc: Oral Oral Oral Oral  Resp: 18 18 18 18   Height:      Weight: 102.876 kg (226 lb 12.8 oz)   102.7 kg (226 lb 6.6 oz)  SpO2: 97% 97% 97% 90%    Intake/Output from previous day:  Intake/Output Summary (Last 24 hours) at 03/30/11 0936 Last data filed at 03/30/11 0757  Gross per 24 hour  Intake    840 ml  Output   1425 ml  Net   -585 ml    Physical Exam: Obese Decreased BS left base S1S2 no obvious rub BS positive nontender Trace bilateral edema Neuro nonfocal Skin warm and dry Plus 2 bilateral PT Left thoracentesis site A  Lab Results: Basic Metabolic Panel:  Basename 03/28/11 0515  NA 139  K 4.0  CL 102  CO2 30  GLUCOSE 108*  BUN 14  CREATININE 0.72  CALCIUM 9.1  MG --  PHOS --   Liver Function Tests: No results found for this basename: AST:2,ALT:2,ALKPHOS:2,BILITOT:2,PROT:2,ALBUMIN:2 in the last 72 hours No results found for this basename: LIPASE:2,AMYLASE:2 in the last 72 hours CBC:  Basename 03/30/11 0500 03/29/11 0720  WBC 8.3 7.9  NEUTROABS -- --  HGB 11.8* 11.4*  HCT 37.1 37.5  MCV 84.3 86.2  PLT 520* 570*    Imaging: Dg Chest 2 View  03/29/2011  *RADIOLOGY REPORT*  Clinical Data: Evaluate pleural effusion  CHEST - 2 VIEW  Comparison: 03/27/2011; 03/24/2011; chest CT - 03/25/2011  Findings:  Grossly unchanged enlarged cardiac silhouette and mediastinal contours. The size of small left-sided pleural effusion is grossly unchanged following recent thoracentesis.  No pneumothorax. Grossly unchanged left basilar heterogeneous /  consolidative opacity.  The right hemithorax is unchanged.  Unchanged bones.  IMPRESSION: 1.  Unchanged size of small left-sided pleural effusion post recent thoracentesis. No pneumothorax.  2.  Grossly unchanged left basilar heterogeneous / consolidative opacities favored to represent atelectasis.  Original Report Authenticated By: Waynard Reeds, M.D.    Cardiac Studies:  ECG:  1/26  SR recent anterolateral MI   Telemetry: NSR no PAF or VT  Echo:   Medications:      . aspirin EC  325 mg Oral QID  . citalopram  10 mg Oral Daily  . clopidogrel  75 mg Oral Q breakfast  . cyanocobalamin  1,000 mcg Intramuscular Q7 days  . cyanocobalamin  500 mcg Oral Daily  . enoxaparin (LOVENOX) injection  40 mg Subcutaneous Q24H  . gabapentin  600 mg Oral TID  . metoprolol tartrate  12.5 mg Oral BID  . Milnacipran  100 mg Oral BID  . multivitamins with iron  1 tablet Oral Daily  . pantoprazole  40 mg Oral Q0600  . rOPINIRole  2 mg Oral QHS  . tiZANidine  6 mg Oral TID  . Vitamin D (Ergocalciferol)  50,000 Units Oral Q7 days       Assessment/Plan:  Anterior MI:  Repeat echo next week or two No evidence of tamponade  Volume  improved  She has significant GERD and requires high dose proton pump inhibitor D/C with Effient instead of Plavix Dresslers:  Post MI pericarditis no NSAI's or steroids per TS  Continue q6 ASA Pleural Effusion:  S/P left thoracentesis with stable CXR yesterday.    D/C today.  F/U TS and echo in 1-2 weeks  Change to Effient 10mg  and Protonix 40mg .  See nursing note D/C Celexa to avoid seratonergic reuptake syndrome  Charlton Haws 03/30/2011, 9:36 AM

## 2011-03-31 LAB — BODY FLUID CULTURE: Culture: NO GROWTH

## 2011-04-09 ENCOUNTER — Telehealth: Payer: Self-pay | Admitting: Cardiology

## 2011-04-10 NOTE — Telephone Encounter (Signed)
Appointment scheduled per Dr Rosalyn Charters request.

## 2011-04-11 ENCOUNTER — Ambulatory Visit (INDEPENDENT_AMBULATORY_CARE_PROVIDER_SITE_OTHER): Payer: Self-pay | Admitting: Cardiology

## 2011-04-11 ENCOUNTER — Ambulatory Visit (HOSPITAL_COMMUNITY)
Admission: RE | Admit: 2011-04-11 | Discharge: 2011-04-11 | Disposition: A | Discharge status: Home / Self Care | Payer: Self-pay | Source: Ambulatory Visit | Attending: Cardiology | Admitting: Cardiology

## 2011-04-11 ENCOUNTER — Encounter: Payer: Self-pay | Admitting: Cardiology

## 2011-04-11 VITALS — BP 118/79 | HR 109 | Ht 63.0 in | Wt 224.8 lb

## 2011-04-11 DIAGNOSIS — I2510 Atherosclerotic heart disease of native coronary artery without angina pectoris: Secondary | ICD-10-CM

## 2011-04-11 DIAGNOSIS — R030 Elevated blood-pressure reading, without diagnosis of hypertension: Secondary | ICD-10-CM | POA: Insufficient documentation

## 2011-04-11 DIAGNOSIS — D649 Anemia, unspecified: Secondary | ICD-10-CM

## 2011-04-11 DIAGNOSIS — I309 Acute pericarditis, unspecified: Secondary | ICD-10-CM

## 2011-04-11 DIAGNOSIS — Z720 Tobacco use: Secondary | ICD-10-CM

## 2011-04-11 DIAGNOSIS — I517 Cardiomegaly: Secondary | ICD-10-CM | POA: Insufficient documentation

## 2011-04-11 DIAGNOSIS — J181 Lobar pneumonia, unspecified organism: Secondary | ICD-10-CM

## 2011-04-11 DIAGNOSIS — J13 Pneumonia due to Streptococcus pneumoniae: Secondary | ICD-10-CM

## 2011-04-11 DIAGNOSIS — I2109 ST elevation (STEMI) myocardial infarction involving other coronary artery of anterior wall: Secondary | ICD-10-CM

## 2011-04-11 DIAGNOSIS — F17200 Nicotine dependence, unspecified, uncomplicated: Secondary | ICD-10-CM

## 2011-04-11 LAB — BASIC METABOLIC PANEL
BUN: 12 mg/dL (ref 6–23)
Chloride: 105 mEq/L (ref 96–112)
Creatinine, Ser: 0.8 mg/dL (ref 0.4–1.2)
Glucose, Bld: 90 mg/dL (ref 70–99)
Potassium: 4 mEq/L (ref 3.5–5.1)

## 2011-04-11 LAB — CBC WITH DIFFERENTIAL/PLATELET
Basophils Relative: 0.8 % (ref 0.0–3.0)
Eosinophils Relative: 15.2 % — ABNORMAL HIGH (ref 0.0–5.0)
Hemoglobin: 12.8 g/dL (ref 12.0–15.0)
Lymphocytes Relative: 19.3 % (ref 12.0–46.0)
MCV: 83.4 fl (ref 78.0–100.0)
Neutro Abs: 6.3 10*3/uL (ref 1.4–7.7)
Neutrophils Relative %: 57.9 % (ref 43.0–77.0)
RBC: 4.71 Mil/uL (ref 3.87–5.11)
WBC: 10.9 10*3/uL — ABNORMAL HIGH (ref 4.5–10.5)

## 2011-04-11 MED ORDER — ASPIRIN 325 MG PO TBEC: 325.0000 mg | DELAYED_RELEASE_TABLET | Freq: Every day | ORAL | Status: DC

## 2011-04-11 NOTE — Progress Notes (Signed)
HPI:  Kristy Sloan is actually getting along fairly well.  She still has some chest discomfort over the left flank area with inspiration.  She is only taking ASA twice daily at this point in time, and tolerating it fairly well.  No progressive symptoms.  She underwent thoracentesis of the left pleural effusion during her recent hospitalization which was associated with removal of fluid which was an exudate.  This was thought to be compatible with Dressler's syndrome.  She actually is slowly doing bit better.    Current Outpatient Prescriptions  Medication Sig Dispense Refill  . clopidogrel (PLAVIX) 75 MG tablet Take 75 mg by mouth daily.      . cyanocobalamin (,VITAMIN B-12,) 1000 MCG/ML injection Inject 1,000 mcg into the muscle every 7 (seven) days. Takes on Saturday       . cyanocobalamin 500 MCG tablet Take 500 mcg by mouth daily.        [name] Sodium (DOC-Q-LACE PO) Take 100 mg by mouth as needed.      . FeFum-FePo-FA-B Cmp-C-Zn-Mn-Cu (SE-TAN PLUS PO) Take by mouth daily.      . ferrous fumarate-iron polysaccharide complex (TANDEM) 162-115.2 MG CAPS Take 1 capsule by mouth daily.        Marland Kitchen gabapentin (NEURONTIN) 300 MG capsule Take 600 mg by mouth 3 (three) times daily.        Marland Kitchen HYDROcodone-acetaminophen (LORTAB) 10-500 MG per tablet Take 1 tablet by mouth every 6 (six) hours as needed. For pain      . metoprolol tartrate (LOPRESSOR) 25 MG tablet Take 0.5 tablets (12.5 mg total) by mouth 2 (two) times daily.      . Milnacipran (SAVELLA) 50 MG TABS Take 100 mg by mouth 2 (two) times daily.        . nitroGLYCERIN (NITROSTAT) 0.4 MG SL tablet Place 0.4 mg under the tongue every 5 (five) minutes as needed. For chest pain      . pantoprazole (PROTONIX) 40 MG tablet Take 40 mg by mouth daily at 6 (six) AM.      . rOPINIRole (REQUIP) 0.5 MG tablet Take 2 mg by mouth at bedtime.       . simvastatin (ZOCOR) 20 MG tablet Take 20 mg by mouth daily at 6 PM.      . tiZANidine (ZANAFLEX) 4 MG tablet Take 4  mg by mouth every 6 (six) hours as needed. 1 and 1/2 tablet Three times daily      . Vitamin D, Ergocalciferol, (DRISDOL) 50000 UNITS CAPS Take 50,000 Units by mouth every 7 (seven) days. Take on wednesday      . zolpidem (AMBIEN) 10 MG tablet Take 10 mg by mouth at bedtime as needed. For sleep      . aspirin 325 MG EC tablet Take 1 tablet (325 mg total) by mouth daily.  30 tablet  6    No Known Allergies  Past Medical History  Diagnosis Date  . Anxiety   . Spinal stenosis   . Fibromyalgia   . Restless legs syndrome   . Tobacco abuse     reports quitting 12/2010  . Pericarditis     In setting of after anterior MI 03/2011  . Sleep apnea   . Coronary artery disease     A.  03/05/2011 - Ant MI - LAD stented w/ 2.75 x 16 mm Promus  DES - course complicated by Pericarditis, re-look cath ok. 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 GERD (gastroesophageal reflux disease)   .  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 Pleural effusion, left     Felt related to Dressler's s/p thoracentesis 03/27/11    Past Surgical History  Procedure Date  . Hand surgery   . Cesarean section   . Cardiac catheterization   . Coronary angioplasty   . Tubal ligation     Family History  Problem Relation Age of Onset  . Coronary artery disease Father   . Coronary artery disease Sister     Posey Rea of age, but sister is younger    History   Social History  . Marital Status: Married    Spouse Name: N/A    Number of Children: N/A  . Years of Education: N/A   Occupational History  . Not on file.   Social History Main Topics  . Smoking status: Former Smoker    Types: Cigarettes    Quit date: 12/04/2010  . Smokeless tobacco: Not on file   Comment: Quit 3 months ago  . Alcohol Use: No  . Drug Use: No  . Sexually Active: Not Currently   Other Topics Concern  . Not on file   Social History Narrative   [place] Mar[place]. Doesn't currently work.    ROS: Please  see the HPI.  All other systems reviewed and negative.  PHYSICAL EXAM:  BP 118/79  Pulse 109  Ht 5\' 3"  (1.6 m)  Wt 224 lb 12.8 oz (101.969 kg)  BMI 39.82 kg/m2  LMP 03/02/2011  General: Well developed, well nourished, in no acute distress. Head:  Normocephalic and atraumatic. Neck: no JVD.  Carefully reviewed.   Lungs: Clear to auscultation and percussion. Heart: Normal S1 and S2.  No murmur, rubs or gallops.  I listened carefully for a rub.   Abdomen:  Normal bowel sounds; soft; non tender; no organomegaly Pulses: Pulses normal in all 4 extremities. Extremities: No clubbing or cyanosis. No edema. Neurologic: Alert and oriented x 3.  EKG:  NSR.   Anterior MI, indeterminate age.  Persistent inferolateral and anterolateral T inversion.   ASSESSMENT AND PLAN:

## 2011-04-11 NOTE — Patient Instructions (Signed)
Your physician recommends that you schedule a follow-up appointment in: 2 weeks.  Your physician has recommended you make the following change in your medication: Decrease aspirin to 325 mg by mouth daily.  Have chest X-ray done today at [facility] department.

## 2011-04-13 NOTE — Assessment & Plan Note (Signed)
She has not smoked.

## 2011-04-13 NOTE — Assessment & Plan Note (Signed)
Presented late and had stenting of the LAD.  Has moderate ostial disease as well, and we are monitoring.  She had alreadly embolized distal embolus at the time of presentation, and with late arrival, had dead myocardium at the apex which I told her would likely lead to pericardial symptoms.  Not unstable at this point.  Will continue her on medical therapy.

## 2011-04-13 NOTE — Assessment & Plan Note (Signed)
Will recheck CXR and assess her status.

## 2011-04-13 NOTE — Assessment & Plan Note (Signed)
She is symptomatically better, and as such, I think it makes sense for her to cut back her meds to once daily.  We talked about an echocardiogram, but it even was a bit of a struggle to get her to the office because of the copay, and fortunately she is not short of breath and there is no JVD at all.  I convinced her to return in early follow up, and to agree to some lab studies today.  If she gets progressive shortness of breath she knows to come to the hospital for early care, having foregone that on her recent admission which has led to the current issues.  She will remain on ASA.  I reviewed the treatment guidelines for this.

## 2011-04-13 NOTE — Assessment & Plan Note (Signed)
Need to recheck her hgb.

## 2011-04-17 DIAGNOSIS — R079 Chest pain, unspecified: Secondary | ICD-10-CM

## 2011-04-28 ENCOUNTER — Encounter: Payer: Self-pay | Admitting: Cardiology

## 2011-04-28 ENCOUNTER — Ambulatory Visit (INDEPENDENT_AMBULATORY_CARE_PROVIDER_SITE_OTHER): Payer: Self-pay | Admitting: Cardiology

## 2011-04-28 DIAGNOSIS — Z720 Tobacco use: Secondary | ICD-10-CM

## 2011-04-28 DIAGNOSIS — D649 Anemia, unspecified: Secondary | ICD-10-CM

## 2011-04-28 DIAGNOSIS — I2510 Atherosclerotic heart disease of native coronary artery without angina pectoris: Secondary | ICD-10-CM

## 2011-04-28 DIAGNOSIS — R109 Unspecified abdominal pain: Secondary | ICD-10-CM

## 2011-04-28 DIAGNOSIS — J181 Lobar pneumonia, unspecified organism: Secondary | ICD-10-CM

## 2011-04-28 DIAGNOSIS — I309 Acute pericarditis, unspecified: Secondary | ICD-10-CM

## 2011-04-28 DIAGNOSIS — F17200 Nicotine dependence, unspecified, uncomplicated: Secondary | ICD-10-CM

## 2011-04-28 DIAGNOSIS — J13 Pneumonia due to Streptococcus pneumoniae: Secondary | ICD-10-CM

## 2011-04-28 DIAGNOSIS — I2109 ST elevation (STEMI) myocardial infarction involving other coronary artery of anterior wall: Secondary | ICD-10-CM

## 2011-04-28 MED ORDER — ASPIRIN 81 MG PO TBEC: 81.0000 mg | DELAYED_RELEASE_TABLET | Freq: Every day | ORAL | Status: DC

## 2011-04-28 NOTE — Patient Instructions (Addendum)
Your physician has recommended you make the following change in your medication: DECREASE Aspirin to 81mg  take one by mouth daily, DECREASE Tandem to once a week  Non-Cardiac CT scanning, (CAT scanning), is a noninvasive, special x-ray that produces cross-sectional images of the body using x-rays and a computer. CT scans help physicians diagnose and treat medical conditions. For some CT exams, a contrast material is used to enhance visibility in the area of the body being studied. CT scans provide greater clarity and reveal more details than regular x-ray exams. CT of ABDOMEN and PELVIS with and without contrast  Your physician recommends that you schedule a follow-up appointment in: 4 WEEKS  Your physician recommends that you have lab work today: CBC and BMP

## 2011-04-29 LAB — CBC WITH DIFFERENTIAL/PLATELET
Basophils Absolute: 0.1 10*3/uL (ref 0.0–0.1)
HCT: 44.6 % (ref 36.0–46.0)
Lymphs Abs: 2.5 10*3/uL (ref 0.7–4.0)
MCV: 84 fl (ref 78.0–100.0)
Monocytes Absolute: 0.5 10*3/uL (ref 0.1–1.0)
Monocytes Relative: 3.2 % (ref 3.0–12.0)
Neutrophils Relative %: 66.3 % (ref 43.0–77.0)
Platelets: 357 10*3/uL (ref 150.0–400.0)
RDW: 18.4 % — ABNORMAL HIGH (ref 11.5–14.6)

## 2011-04-29 LAB — BASIC METABOLIC PANEL
Calcium: 9.3 mg/dL (ref 8.4–10.5)
GFR: 75.79 mL/min (ref >60.00)
Potassium: 4.1 mEq/L (ref 3.5–5.1)
Sodium: 138 mEq/L (ref 135–145)

## 2011-04-30 NOTE — Progress Notes (Signed)
HPI:  Kristy Sloan is back in for follow up. Generally she thinks she is doing better.  She does complain of discomfort over the right lower abdomen which she describes as being there since the time of her urgent repeat catheterization. However, very small caliber catheters were used for the relook study.  Of note, she is only take one asa per day at this juncture.  She is also on some iron.  No definite chest pain, and her shortness of breath has actually improved.  Denies other symptoms.    Current Outpatient Prescriptions  Medication Sig Dispense Refill  . aspirin 81 MG EC tablet Take 1 tablet (81 mg total) by mouth daily.      . citalopram (CELEXA) 10 MG tablet Take 10 mg by mouth daily.      . clopidogrel (PLAVIX) 75 MG tablet Take 75 mg by mouth daily.      . cyanocobalamin (,VITAMIN B-12,) 1000 MCG/ML injection Inject 1,000 mcg into the muscle every 7 (seven) days. Takes on Saturday       . cyanocobalamin 500 MCG tablet Take 500 mcg by mouth daily.        [name] Sodium (DOC-Q-LACE PO) Take 100 mg by mouth as needed.      . ferrous fumarate-iron polysaccharide complex (TANDEM) 162-115.2 MG CAPS Take 1 capsule by mouth once a week.      . gabapentin (NEURONTIN) 300 MG capsule Take 600 mg by mouth 3 (three) times daily.        Marland Kitchen HYDROcodone-acetaminophen (LORTAB) 10-500 MG per tablet Take 1 tablet by mouth every 4 (four) hours as needed. For pain      . metoprolol tartrate (LOPRESSOR) 25 MG tablet Take 25 mg by mouth 2 (two) times daily.      . Milnacipran (SAVELLA) 50 MG TABS Take 100 mg by mouth 2 (two) times daily.        . nitroGLYCERIN (NITROSTAT) 0.4 MG SL tablet Place 0.4 mg under the tongue every 5 (five) minutes as needed. For chest pain      . pantoprazole (PROTONIX) 40 MG tablet Take 40 mg by mouth daily at 6 (six) AM.      . rOPINIRole (REQUIP) 0.5 MG tablet Take 2 mg by mouth at bedtime.       . simvastatin (ZOCOR) 20 MG tablet Take 20 mg by mouth daily at 6 PM.      . tiZANidine  (ZANAFLEX) 4 MG tablet Take 4 mg by mouth every 6 (six) hours as needed. 1 and 1/2 tablet Three times daily      . Vitamin D, Ergocalciferol, (DRISDOL) 50000 UNITS CAPS Take 50,000 Units by mouth every 7 (seven) days. Take on wednesday      . zolpidem (AMBIEN) 10 MG tablet Take 10 mg by mouth at bedtime as needed. For sleep        No Known Allergies  Past Medical History  Diagnosis Date  . Anxiety   . Spinal stenosis   . Fibromyalgia   . Restless legs syndrome   . Tobacco abuse     reports quitting 12/2010  . Pericarditis     In setting of after anterior MI 03/2011  . Sleep apnea   . Coronary artery disease     A.  03/05/2011 - Ant MI - LAD stented w/ 2.75 x 16 mm Promus  DES - course complicated by Pericarditis, re-look cath ok. 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 GERD (gastroesophageal reflux disease)   .  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 Pleural effusion, left     Felt related to Dressler's s/p thoracentesis 03/27/11    Past Surgical History  Procedure Date  . Hand surgery   . Cesarean section   . Cardiac catheterization   . Coronary angioplasty   . Tubal ligation     Family History  Problem Relation Age of Onset  . Coronary artery disease Father   . Coronary artery disease Sister     Posey Rea of age, but sister is younger    History   Social History  . Marital Status: Married    Spouse Name: N/A    Number of Children: N/A  . Years of Education: N/A   Occupational History  . Not on file.   Social History Main Topics  . Smoking status: Former Smoker    Types: Cigarettes    Quit date: 12/04/2010  . Smokeless tobacco: Not on file   Comment: Quit 3 months ago  . Alcohol Use: No  . Drug Use: No  . Sexually Active: Not Currently   Other Topics Concern  . Not on file   Social History Narrative   [place] Married. Doesn't currently work.    ROS: Please see the HPI.  All other systems reviewed and  negative.  PHYSICAL EXAM:  BP 122/78  Pulse 92  Ht 5\' 3"  (1.6 m)  Wt 225 lb (102.059 kg)  BMI 39.86 kg/m2  General: Well developed, well nourished, in no acute distress. Head:  Normocephalic and atraumatic. Neck: no JVD Lungs: Clear to auscultation and percussion. Heart: Normal S1 and S2.  No murmur, rubs or gallops.  Abdomen:  Normal bowel sounds; soft; non tender; no organomegaly.  She does have some minor fullness in the right lowere abdomen, just above the area of the inguinal ligament.   Pulses: Pulses normal in all 4 extremities. Extremities: No clubbing or cyanosis. No edema. Neurologic: Alert and oriented x 3.  EKG:  NSR.  Anterior and inferior MI with persistent STE and flipped T waves.    ASSESSMENT AND PLAN:

## 2011-05-02 ENCOUNTER — Ambulatory Visit (INDEPENDENT_AMBULATORY_CARE_PROVIDER_SITE_OTHER)
Admission: RE | Admit: 2011-05-02 | Discharge: 2011-05-02 | Disposition: A | Discharge status: Home / Self Care | Payer: Self-pay | Source: Ambulatory Visit | Attending: Cardiology | Admitting: Cardiology

## 2011-05-02 DIAGNOSIS — R109 Unspecified abdominal pain: Secondary | ICD-10-CM

## 2011-05-02 DIAGNOSIS — I2510 Atherosclerotic heart disease of native coronary artery without angina pectoris: Secondary | ICD-10-CM

## 2011-05-02 MED ORDER — IOHEXOL 300 MG/ML  SOLN
100.0000 mL | Freq: Once | INTRAMUSCULAR | Status: AC | PRN
  Administered 2011-05-02: 100 mL via INTRAVENOUS

## 2011-05-03 DIAGNOSIS — R109 Unspecified abdominal pain: Secondary | ICD-10-CM | POA: Insufficient documentation

## 2011-05-03 NOTE — Assessment & Plan Note (Signed)
Has discomfort just above inguinal ligament on the right.  Thinks this occurred mainly after the repeat cath which was done emergently when she was really sick.  Some tenderness in the soft tissue area above right inguinal area, but no large mass, nothing to suggest an abscess.  However, will check CT to exclude something more worrisome.

## 2011-05-03 NOTE — Assessment & Plan Note (Signed)
Prior MI related to late presentation with distal emblolization--prox vessel stented with some ostial disease--distal occluded with pleuropericarditis as suspected  (Dresslers like).  Required pleural tap for expanding effusion, but has done well overall since that time.  She is not experiencing much in the way of chest discomfort over all.

## 2011-05-03 NOTE — Assessment & Plan Note (Signed)
Still has some discomfort around to the side where she was tapped.  Symptoms mostly with inspiration. ASA dosing has been down.  Will continue to monitor.

## 2011-05-03 NOTE — Assessment & Plan Note (Signed)
Will recheck CBC at this time.  Did not actively lose blood.  More was an issue of blood loss during the hospitalization.

## 2011-05-03 NOTE — Assessment & Plan Note (Signed)
Not smoking.  

## 2011-05-03 NOTE — Assessment & Plan Note (Signed)
She was improved on exam.  Will need follow up xray to resolution in a month.

## 2011-05-04 DIAGNOSIS — R0789 Other chest pain: Secondary | ICD-10-CM

## 2011-05-04 DIAGNOSIS — R110 Nausea: Secondary | ICD-10-CM

## 2011-06-05 ENCOUNTER — Ambulatory Visit (INDEPENDENT_AMBULATORY_CARE_PROVIDER_SITE_OTHER): Payer: Self-pay | Admitting: Cardiology

## 2011-06-05 ENCOUNTER — Encounter: Payer: Self-pay | Admitting: Cardiology

## 2011-06-05 VITALS — BP 110/74 | HR 97 | Ht 63.0 in | Wt 224.1 lb

## 2011-06-05 DIAGNOSIS — D649 Anemia, unspecified: Secondary | ICD-10-CM

## 2011-06-05 DIAGNOSIS — I2510 Atherosclerotic heart disease of native coronary artery without angina pectoris: Secondary | ICD-10-CM | POA: Insufficient documentation

## 2011-06-05 DIAGNOSIS — I241 Dressler's syndrome: Secondary | ICD-10-CM

## 2011-06-05 DIAGNOSIS — F17200 Nicotine dependence, unspecified, uncomplicated: Secondary | ICD-10-CM

## 2011-06-05 DIAGNOSIS — Z720 Tobacco use: Secondary | ICD-10-CM

## 2011-06-05 NOTE — Progress Notes (Signed)
Addended by: Arisa Congleton D on: 06/05/2011 06:30 PM   Modules accepted: Orders  

## 2011-06-05 NOTE — Progress Notes (Signed)
HPI:  She is in for a follow up visit.  She recently spent a night in the hospital at [facility].  She now has chest pain that has gotten worse over the past few days.  Some of it is inspirational, but some of it is related to sex and activity.  When she tries to go out and walk, she will get the discomfort.  She was able to reproduce some of it today by taking a breath in clinic, but she said it has progressed.  I reviewed her films today in the office with Dr. Elease Hashimoto and also reviewed them with the patient.  With the increasing frequency, we have suggested a repeat cath because of the nature of her anatomic findings at the time of her event.    Current Outpatient Prescriptions  Medication Sig Dispense Refill  . aspirin 81 MG EC tablet Take 1 tablet (81 mg total) by mouth daily.      . citalopram (CELEXA) 10 MG tablet Take 10 mg by mouth daily.      . clopidogrel (PLAVIX) 75 MG tablet Take 75 mg by mouth daily.      . cyanocobalamin (,VITAMIN B-12,) 1000 MCG/ML injection Inject 1,000 mcg into the muscle every 7 (seven) days. Takes on Saturday       . cyanocobalamin 500 MCG tablet Take 500 mcg by mouth daily.        Kristy Sloan Sodium (DOC-Q-LACE PO) Take 100 mg by mouth as needed.      . ferrous fumarate-iron polysaccharide complex (TANDEM) 162-115.2 MG CAPS Take 1 capsule by mouth once a week.      . gabapentin (NEURONTIN) 300 MG capsule Take 600 mg by mouth 3 (three) times daily.        Marland Kitchen HYDROcodone-acetaminophen (LORTAB) 10-500 MG per tablet Take 1 tablet by mouth every 4 (four) hours as needed. For pain      . metoprolol tartrate (LOPRESSOR) 25 MG tablet Take 25 mg by mouth 2 (two) times daily.      . Milnacipran (SAVELLA) 50 MG TABS Take 100 mg by mouth 2 (two) times daily.        . nitroGLYCERIN (NITROSTAT) 0.4 MG SL tablet Place 0.4 mg under the tongue every 5 (five) minutes as needed. For chest pain      . pantoprazole (PROTONIX) 40 MG tablet Take 40 mg by mouth daily at 6 (six) AM.        . rOPINIRole (REQUIP) 0.5 MG tablet Take 3 mg by mouth at bedtime.       . simvastatin (ZOCOR) 20 MG tablet Take 20 mg by mouth daily at 6 PM.      . tiZANidine (ZANAFLEX) 4 MG tablet Take 4 mg by mouth every 6 (six) hours as needed. 1 and 1/2 tablet Three times daily      . Vitamin D, Ergocalciferol, (DRISDOL) 50000 UNITS CAPS Take 50,000 Units by mouth every 7 (seven) days. Take on wednesday      . zolpidem (AMBIEN) 10 MG tablet Take 10 mg by mouth at bedtime as needed. For sleep        No Known Allergies  Past Medical History  Diagnosis Date  . Anxiety   . Spinal stenosis   . Fibromyalgia   . Restless legs syndrome   . Tobacco abuse     reports quitting 12/2010  . Pericarditis     In setting of after anterior MI 03/2011  . Sleep apnea   .  Coronary artery disease     A.  03/05/2011 - Ant MI - LAD stented w/ 2.75 x 16 mm Promus  DES - course complicated by Pericarditis, re-look cath ok. 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 GERD (gastroesophageal reflux disease)   . 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 Pleural effusion, left     Felt related to Dressler's s/p thoracentesis 03/27/11    Past Surgical History  Procedure Date  . Hand surgery   . Cesarean section   . Cardiac catheterization   . Coronary angioplasty   . Tubal ligation     Family History  Problem Relation Age of Onset  . Coronary artery disease Father   . Coronary artery disease Sister     Posey Rea of age, but sister is younger    History   Social History  . Marital Status: Married    Spouse Name: N/A    Number of Children: N/A  . Years of Education: N/A   Occupational History  . Not on file.   Social History Main Topics  . Smoking status: Former Smoker    Types: Cigarettes    Quit date: 12/04/2010  . Smokeless tobacco: Not on file   Comment: Quit 3 months ago  . Alcohol Use: No  . Drug Use: No  . Sexually Active: Not Currently   Other Topics Concern  . Not on file   Social  History Narrative   [place] Married. Doesn't currently work.    ROS: Please see the HPI.  All other systems reviewed and negative.  PHYSICAL EXAM:  BP 110/74  Pulse 97  Ht 5\' 3"  (1.6 m)  Wt 224 lb 1.9 oz (101.66 kg)  BMI 39.70 kg/m2  SpO2 100%  General: Well developed, well nourished, in no acute distress. Head:  Normocephalic and atraumatic. Neck: no JVD Lungs: Clear to auscultation and percussion.  No obvious effusion Heart: Normal S1 and S2.  Pos S4.  No murmur Abdomen:  Normal bowel sounds; soft; non tender; no organomegaly Pulses: Pulses normal in all 4 extremities. Extremities: No clubbing or cyanosis. No edema. Neurologic: Alert and oriented x 3.  EKG:  NSR. Anteror MI, old.  Lower voltage QRS  CATH  (03/2011) Left mainstem: The LMCA was free of significant disease.  Left anterior descending (LAD):  The left anterior descending artery coursed to the cardiac apex. The ostium had about 40% eccentric narrowing. Between the ostium and the major diagonal branch, there was evidence of a ruptured plaque, with hazy appearance and 80% eccentric stenosis. This was stented to 0% across the smaller diagonal branch without compromise. The distal LAD was totally occluded due to what appeared to be most likely distal embolization, not from the procedure, but spontaneous (probably yesterday and the likely source of continuing pain and ECG change). Despite dilatation with small balloons, no reperfusion was established in the apical portion of the vessel. We thought the vessel was likely to small for aspiration, and could also represent apical edema preventing flow. The proximal site, source of likely embolization, appeared excellent post PCI.  Left circumflex (LCx): The ramus and AV circ supplying the OM was of moderate size, and without critical narrowing.  Right coronary artery (RCA): The proximal RCA had mild irregularity. The distal vessel supplied a smaller  caliber PDA and PLA, without significant narrowing.  Left ventriculography: Left ventricular systolic function is reduced, LVEF is estimated at 45%, there is no significant mitral regurgitation.  There is an apical WMA.  PCI Data:  Vessel - LAD/Segment - 12  Percent Stenosis (pre) 80% ruptured eccentric plaque  TIMI-flow 3  Stent 20 by 2.75 Promus Element  Percent Stenosis (post) 0  TIMI-flow (post) 3  Final Conclusions:  1. Acute anterior MI with ruptured proximal LAD lesion, with distal embolization and occlusion of the apical LAD.  2. Successful PCI of the proximal ruptured plaque with DES (Promus Element)  3. Persistent occlusion of the apical LAD despite balloon and "dottering" (small caliber vessel)  4. No critical disease in CFX or RCA  5. 40-50% ostial LAD disease, but widely patent  Recommendations:  1. DAPT for minimum one year  2. Beta blockers, ACE, and statin  3. Tobacco cessation treatment  4. 2D echo to exclude apical mural thrombus.  5. Immediate monitoring in CCU.      ASSESSMENT AND PLAN:

## 2011-06-05 NOTE — Assessment & Plan Note (Signed)
13.8 on recent admission to the hospital.

## 2011-06-05 NOTE — Assessment & Plan Note (Signed)
Recent MI with occlusion of the distal MI, from embolization from proximal LAD ruptured plaque, with successful DES stenting of the LAD with follow up angio 24 hours later.  Has some plaque proximal to stent, but looks more like non obstructive.  Pain may be ischemic, but more likely secondary to residual Dresslers.  Favor repeat cath to document proximal LAD patency, then treatment if ok.  Explained to patient in detail and she is agreeable.

## 2011-06-05 NOTE — Patient Instructions (Signed)

## 2011-06-05 NOTE — Assessment & Plan Note (Signed)
Not currently smoking 

## 2011-06-05 NOTE — Assessment & Plan Note (Signed)
No current rub, but pain is inspirational at times, and could well be residual.  Off of antiinflammatories which we will use if we in fact demonstrate vessel patency.  Patient has EES stent  (2  Gen).  Had pleural effusion which was tapped.

## 2011-06-06 ENCOUNTER — Other Ambulatory Visit: Payer: Self-pay

## 2011-06-06 ENCOUNTER — Encounter (HOSPITAL_COMMUNITY)
Admission: RE | Disposition: A | Discharge status: Home / Self Care | Payer: Self-pay | Source: Ambulatory Visit | Attending: Cardiology

## 2011-06-06 ENCOUNTER — Ambulatory Visit (HOSPITAL_COMMUNITY): Payer: Self-pay

## 2011-06-06 ENCOUNTER — Inpatient Hospital Stay (HOSPITAL_COMMUNITY)
Admission: RE | Admit: 2011-06-06 | Discharge: 2011-06-13 | DRG: 247 | Disposition: A | Discharge status: Home / Self Care | Payer: Self-pay | Source: Ambulatory Visit | Attending: Cardiology | Admitting: Cardiology

## 2011-06-06 ENCOUNTER — Encounter (HOSPITAL_COMMUNITY): Payer: Self-pay | Admitting: Cardiology

## 2011-06-06 DIAGNOSIS — D649 Anemia, unspecified: Secondary | ICD-10-CM

## 2011-06-06 DIAGNOSIS — Z7901 Long term (current) use of anticoagulants: Secondary | ICD-10-CM

## 2011-06-06 DIAGNOSIS — I200 Unstable angina: Secondary | ICD-10-CM

## 2011-06-06 DIAGNOSIS — Z7902 Long term (current) use of antithrombotics/antiplatelets: Secondary | ICD-10-CM

## 2011-06-06 DIAGNOSIS — I252 Old myocardial infarction: Secondary | ICD-10-CM

## 2011-06-06 DIAGNOSIS — I2109 ST elevation (STEMI) myocardial infarction involving other coronary artery of anterior wall: Secondary | ICD-10-CM

## 2011-06-06 DIAGNOSIS — I241 Dressler's syndrome: Secondary | ICD-10-CM | POA: Insufficient documentation

## 2011-06-06 DIAGNOSIS — I2510 Atherosclerotic heart disease of native coronary artery without angina pectoris: Principal | ICD-10-CM | POA: Diagnosis present

## 2011-06-06 DIAGNOSIS — Z9861 Coronary angioplasty status: Secondary | ICD-10-CM

## 2011-06-06 DIAGNOSIS — Z79899 Other long term (current) drug therapy: Secondary | ICD-10-CM

## 2011-06-06 DIAGNOSIS — F411 Generalized anxiety disorder: Secondary | ICD-10-CM | POA: Diagnosis present

## 2011-06-06 DIAGNOSIS — I513 Intracardiac thrombosis, not elsewhere classified: Secondary | ICD-10-CM

## 2011-06-06 DIAGNOSIS — R21 Rash and other nonspecific skin eruption: Secondary | ICD-10-CM | POA: Diagnosis present

## 2011-06-06 DIAGNOSIS — G2581 Restless legs syndrome: Secondary | ICD-10-CM | POA: Diagnosis present

## 2011-06-06 DIAGNOSIS — F17200 Nicotine dependence, unspecified, uncomplicated: Secondary | ICD-10-CM | POA: Diagnosis present

## 2011-06-06 DIAGNOSIS — I10 Essential (primary) hypertension: Secondary | ICD-10-CM | POA: Diagnosis present

## 2011-06-06 DIAGNOSIS — Z7982 Long term (current) use of aspirin: Secondary | ICD-10-CM

## 2011-06-06 DIAGNOSIS — Z720 Tobacco use: Secondary | ICD-10-CM

## 2011-06-06 DIAGNOSIS — E782 Mixed hyperlipidemia: Secondary | ICD-10-CM

## 2011-06-06 DIAGNOSIS — I5189 Other ill-defined heart diseases: Secondary | ICD-10-CM | POA: Diagnosis present

## 2011-06-06 HISTORY — DX: Acute myocardial infarction, unspecified: I21.9

## 2011-06-06 HISTORY — DX: Essential (primary) hypertension: I10

## 2011-06-06 HISTORY — PX: LEFT HEART CATHETERIZATION WITH CORONARY ANGIOGRAM: SHX5451

## 2011-06-06 HISTORY — DX: Intracardiac thrombosis, not elsewhere classified: I51.3

## 2011-06-06 LAB — BASIC METABOLIC PANEL
BUN: 16 mg/dL (ref 6–23)
CO2: 24 mEq/L (ref 19–32)
GFR calc non Af Amer: 78 mL/min — ABNORMAL LOW (ref >90)
Glucose, Bld: 94 mg/dL (ref 70–99)
Potassium: 4.4 mEq/L (ref 3.5–5.1)

## 2011-06-06 LAB — PLATELET INHIBITION P2Y12: Platelet Function  P2Y12: 196 [PRU] (ref 194–418)

## 2011-06-06 LAB — CBC
HCT: 40.6 % (ref 36.0–46.0)
Hemoglobin: 12.9 g/dL (ref 12.0–15.0)
MCH: 26.4 pg (ref 26.0–34.0)
MCHC: 31.8 g/dL (ref 30.0–36.0)
MCV: 83.2 fL (ref 78.0–100.0)
RBC: 4.88 MIL/uL (ref 3.87–5.11)

## 2011-06-06 LAB — POCT ACTIVATED CLOTTING TIME: Activated Clotting Time: 397 seconds

## 2011-06-06 LAB — CARDIAC PANEL(CRET KIN+CKTOT+MB+TROPI)
CK, MB: 1.2 ng/mL (ref 0.3–4.0)
Relative Index: INVALID (ref 0.0–2.5)
Total CK: 70 U/L (ref 7–177)
Troponin I: <0.3 ng/mL (ref <0.30)

## 2011-06-06 LAB — HCG, SERUM, QUALITATIVE: Preg, Serum: NEGATIVE

## 2011-06-06 SURGERY — LEFT HEART CATHETERIZATION WITH CORONARY ANGIOGRAM
Anesthesia: LOCAL

## 2011-06-06 MED ORDER — MILNACIPRAN HCL 50 MG PO TABS
100.0000 mg | ORAL_TABLET | Freq: Two times a day (BID) | ORAL | Status: DC
  Administered 2011-06-06 – 2011-06-13 (×14): 100 mg via ORAL
  Filled 2011-06-06 (×17): qty 2

## 2011-06-06 MED ORDER — ZOLPIDEM TARTRATE 5 MG PO TABS
5.0000 mg | ORAL_TABLET | Freq: Every evening | ORAL | Status: DC | PRN
  Administered 2011-06-07 – 2011-06-11 (×5): 5 mg via ORAL
  Filled 2011-06-06 (×5): qty 1

## 2011-06-06 MED ORDER — DIAZEPAM 5 MG PO TABS
5.0000 mg | ORAL_TABLET | ORAL | Status: AC
  Administered 2011-06-06: 5 mg via ORAL
  Filled 2011-06-06: qty 1

## 2011-06-06 MED ORDER — HEPARIN (PORCINE) IN NACL 2-0.9 UNIT/ML-% IJ SOLN
INTRAMUSCULAR | Status: AC
  Filled 2011-06-06: qty 2000

## 2011-06-06 MED ORDER — ACETAMINOPHEN 325 MG PO TABS: 650.0000 mg | ORAL_TABLET | ORAL | Status: DC | PRN

## 2011-06-06 MED ORDER — SODIUM CHLORIDE 0.9 % IJ SOLN: 3.0000 mL | Freq: Two times a day (BID) | INTRAMUSCULAR | Status: DC

## 2011-06-06 MED ORDER — CITALOPRAM HYDROBROMIDE 10 MG PO TABS
10.0000 mg | ORAL_TABLET | Freq: Every day | ORAL | Status: DC
  Administered 2011-06-06 – 2011-06-13 (×8): 10 mg via ORAL
  Filled 2011-06-06 (×8): qty 1

## 2011-06-06 MED ORDER — CLOPIDOGREL BISULFATE 75 MG PO TABS
ORAL_TABLET | ORAL | Status: AC
  Administered 2011-06-07: 75 mg via ORAL
  Filled 2011-06-06: qty 2

## 2011-06-06 MED ORDER — TIZANIDINE HCL 4 MG PO TABS
4.0000 mg | ORAL_TABLET | Freq: Four times a day (QID) | ORAL | Status: DC | PRN
  Administered 2011-06-07 – 2011-06-11 (×8): 4 mg via ORAL
  Filled 2011-06-06 (×10): qty 1

## 2011-06-06 MED ORDER — SODIUM CHLORIDE 0.9 % IV SOLN: INTRAVENOUS | Status: DC

## 2011-06-06 MED ORDER — SIMVASTATIN 20 MG PO TABS
20.0000 mg | ORAL_TABLET | Freq: Every day | ORAL | Status: DC
  Administered 2011-06-06 – 2011-06-13 (×8): 20 mg via ORAL
  Filled 2011-06-06 (×8): qty 1

## 2011-06-06 MED ORDER — MIDAZOLAM HCL 2 MG/2ML IJ SOLN
INTRAMUSCULAR | Status: AC
  Filled 2011-06-06: qty 2

## 2011-06-06 MED ORDER — LIDOCAINE HCL (PF) 1 % IJ SOLN
INTRAMUSCULAR | Status: AC
  Filled 2011-06-06: qty 30

## 2011-06-06 MED ORDER — FERROUS FUM-IRON POLYSACCH 162-115.2 MG PO CAPS: 1.0000 | ORAL_CAPSULE | ORAL | Status: DC

## 2011-06-06 MED ORDER — HYDROCODONE-ACETAMINOPHEN 10-325 MG PO TABS
1.0000 | ORAL_TABLET | ORAL | Status: DC | PRN
  Administered 2011-06-06 – 2011-06-13 (×38): 1 via ORAL
  Filled 2011-06-06 (×38): qty 1

## 2011-06-06 MED ORDER — SODIUM CHLORIDE 0.9 % IV SOLN: INTRAVENOUS | Status: AC

## 2011-06-06 MED ORDER — SODIUM CHLORIDE 0.9 % IV SOLN: 250.0000 mL | INTRAVENOUS | Status: DC | PRN

## 2011-06-06 MED ORDER — NITROGLYCERIN 0.4 MG SL SUBL
0.4000 mg | SUBLINGUAL_TABLET | SUBLINGUAL | Status: DC | PRN
Start: 2011-06-06 — End: 2011-06-13
  Filled 2011-06-06: qty 25

## 2011-06-06 MED ORDER — METOPROLOL TARTRATE 25 MG PO TABS
25.0000 mg | ORAL_TABLET | Freq: Two times a day (BID) | ORAL | Status: DC
  Administered 2011-06-06 – 2011-06-13 (×13): 25 mg via ORAL
  Filled 2011-06-06 (×15): qty 1

## 2011-06-06 MED ORDER — VITAMIN D (ERGOCALCIFEROL) 1.25 MG (50000 UNIT) PO CAPS
50000.0000 [IU] | ORAL_CAPSULE | ORAL | Status: DC
  Administered 2011-06-11: 50000 [IU] via ORAL
  Filled 2011-06-06: qty 1

## 2011-06-06 MED ORDER — PANTOPRAZOLE SODIUM 40 MG PO TBEC
40.0000 mg | DELAYED_RELEASE_TABLET | Freq: Every day | ORAL | Status: DC
  Administered 2011-06-07 – 2011-06-13 (×7): 40 mg via ORAL
  Filled 2011-06-06 (×7): qty 1

## 2011-06-06 MED ORDER — SODIUM CHLORIDE 0.9 % IJ SOLN: 3.0000 mL | INTRAMUSCULAR | Status: DC | PRN

## 2011-06-06 MED ORDER — ROPINIROLE HCL 1 MG PO TABS
3.0000 mg | ORAL_TABLET | Freq: Every day | ORAL | Status: DC
  Administered 2011-06-06 – 2011-06-12 (×7): 3 mg via ORAL
  Filled 2011-06-06 (×8): qty 3

## 2011-06-06 MED ORDER — FENTANYL CITRATE 0.05 MG/ML IJ SOLN
INTRAMUSCULAR | Status: AC
  Filled 2011-06-06: qty 4

## 2011-06-06 MED ORDER — HYDROCODONE-ACETAMINOPHEN 10-500 MG PO TABS: 1.0000 | ORAL_TABLET | ORAL | Status: DC | PRN

## 2011-06-06 MED ORDER — ONDANSETRON HCL 4 MG/2ML IJ SOLN: 4.0000 mg | Freq: Four times a day (QID) | INTRAMUSCULAR | Status: DC | PRN

## 2011-06-06 MED ORDER — GABAPENTIN 300 MG PO CAPS
600.0000 mg | ORAL_CAPSULE | Freq: Three times a day (TID) | ORAL | Status: DC
  Administered 2011-06-06 – 2011-06-13 (×21): 600 mg via ORAL
  Filled 2011-06-06 (×23): qty 2

## 2011-06-06 MED ORDER — CLOPIDOGREL BISULFATE 75 MG PO TABS
75.0000 mg | ORAL_TABLET | Freq: Every day | ORAL | Status: DC
  Administered 2011-06-07 – 2011-06-13 (×7): 75 mg via ORAL
  Filled 2011-06-06 (×7): qty 1

## 2011-06-06 MED ORDER — POLYSACCHARIDE IRON COMPLEX 150 MG PO CAPS
150.0000 mg | ORAL_CAPSULE | Freq: Every day | ORAL | Status: DC
  Administered 2011-06-07 – 2011-06-13 (×7): 150 mg via ORAL
  Filled 2011-06-06 (×7): qty 1

## 2011-06-06 MED ORDER — BIVALIRUDIN 250 MG IV SOLR
INTRAVENOUS | Status: AC
  Filled 2011-06-06: qty 250

## 2011-06-06 MED ORDER — MIDAZOLAM HCL 2 MG/2ML IJ SOLN
INTRAMUSCULAR | Status: AC
  Filled 2011-06-06: qty 4

## 2011-06-06 MED ORDER — ASPIRIN 81 MG PO CHEW
324.0000 mg | CHEWABLE_TABLET | ORAL | Status: AC
  Administered 2011-06-06: 324 mg via ORAL
  Filled 2011-06-06: qty 4

## 2011-06-06 MED ORDER — MORPHINE SULFATE 2 MG/ML IJ SOLN: 2.0000 mg | INTRAMUSCULAR | Status: DC | PRN

## 2011-06-06 MED ORDER — NITROGLYCERIN 0.2 MG/ML ON CALL CATH LAB
INTRAVENOUS | Status: AC
  Filled 2011-06-06: qty 1

## 2011-06-06 MED ORDER — CYANOCOBALAMIN 500 MCG PO TABS
500.0000 ug | ORAL_TABLET | Freq: Every day | ORAL | Status: DC
  Administered 2011-06-07 – 2011-06-12 (×6): 500 ug via ORAL
  Administered 2011-06-13: 10:00:00 via ORAL
  Filled 2011-06-06 (×7): qty 1

## 2011-06-06 MED ORDER — ASPIRIN EC 81 MG PO TBEC
81.0000 mg | DELAYED_RELEASE_TABLET | Freq: Every day | ORAL | Status: DC
  Administered 2011-06-07 – 2011-06-13 (×7): 81 mg via ORAL
  Filled 2011-06-06 (×7): qty 1

## 2011-06-06 NOTE — Interval H&P Note (Signed)
History and Physical Interval Note:  06/06/2011 1:35 PM  Kristy Sloan  has presented today for surgery, with the diagnosis of Chest Pain  The various methods of treatment have been discussed with the patient and family. After consideration of risks, benefits and other options for treatment, the patient has consented to  Procedure(s) (LRB): LEFT HEART CATHETERIZATION WITH CORONARY ANGIOGRAM (N/A) as a surgical intervention .  The patients' history has been reviewed, patient examined, no change in status, stable for surgery.  I have reviewed the patients' chart and labs.  Questions were answered to the patient's satisfaction.    All data was carefully reviewed.  Cardiac enzymes are negative.  She has 6/10 chest pain at rest, partially worse with inspiration.  No def rub.  Left pleural effusion is resolved by CXR.  However, see my history from yesterday.  If LAD remains patent, then she will need overnight obs with echo and CT chest to resolve any residual effects of initial presentation----r/o pseudoaneurysm, etc.  See original CTs.  Still likely has some component of Dressler's, although pain has increased dramatically over the past week.    She is aware of all of the above, and she, her daughter and I reviewed all of her films in detail last pm in the office.    She is a clopidogrel responder   (PRU less than 208).    Shawnie Pons 1:38 PM 06/06/2011    Shawnie Pons

## 2011-06-06 NOTE — Progress Notes (Signed)
Site area: right groin  Site Prior to Removal:  Level 0  Pressure Applied For 35 MINUTES    Minutes Beginning at 1745  Manual:   yes  Patient Status During Pull:  stable  Post Pull Groin Site:  Level 0  Post Pull Instructions Given:  yes  Post Pull Pulses Present:  yes  Dressing Applied:  yes  Comments:  Extra time used to hold pressure  For hemostasis due small continuous ooze at cath site.

## 2011-06-06 NOTE — Progress Notes (Signed)
TONY,PA NOTIFIED CLIENT CONTINUES TO C/O 6/10 CHEST PAIN AND NO NEW ORDERS NOTED

## 2011-06-06 NOTE — Progress Notes (Signed)
Pt c/o 8/10 mid chest pain that radiates to upper back. Vinnie Langton, RN made aware. Will continue to monitor pt

## 2011-06-06 NOTE — Progress Notes (Signed)
Called by patient's RN re: patient c/o 8/10 chest pain aggravated by inspiration. On evaluation, patient is in NAD. Initial EKG ordered was incomplete, ordered additional EKG. NTG SL x 1 administered. Pain unrelieved. Repeat EKG revealed NSR, no acute ischemic changes. VSS. Upon discussing with Dr. Riley Kill, patient had Dressler's syndrome as recent as 01/13. Had been seen in the office recently complaining of this same pain. Given her cardiac history and risk factors, he elected for a repeat catheterization, but also noted there to be a likely pericardial component to her recent chest pain, including today's. Will order cardiac biomarkers, portable CXR as well. CBC and BMET already ordered. Proceed with cath as scheduled.    Jacqulyn Bath, PA-C 06/06/2011 10:52 AM

## 2011-06-06 NOTE — CV Procedure (Signed)
Cardiac Catheterization Procedure Note  Name: Kristy Sloan MRN: [id number] DOB: [birth year]  Procedure: Left Heart Cath, Selective Coronary Angiography, LV angiography,  PTCA/Stent of LAD  Indication: The patient is well known to me.  She has had recurrent sscp, worse over the past week.  She had post MI Dressler's syndrome, but we were also concerned about the ostial portion of the LAD.  There were some typical and atypical components, but she was progressing, and had 6/10 CP in the holding area.  They moved her up to an urgent.  She was agreeable to proceed after the risks and benefits were explained yesterday.     Diagnostic Procedure Details: The right groin was prepped, draped, and anesthetized with 1% lidocaine. Using the modified Seldinger technique and Smart needle, a 5 French sheath was introduced into the right femoral artery. Standard Judkins catheters were used for selective coronary angiography and left ventriculography. Catheter exchanges were performed over a wire.  The diagnostic procedure was well-tolerated without immediate complications.  I then reviewed with the patient and called Dr. Clifton James to review with me.  Because of her unstable anginal type symptoms, and the appearance of disruption in the proximal LAD, it was felt we should proceed with PCI.  The patient was agreeable, and I went out and met with her husband.  Attention was then turned to PCI.    PROCEDURAL FINDINGS Hemodynamics: AO 116/64 (86) LV 120/11/19 No gradient  Coronary angiography: Coronary dominance: right  Left mainstem: There is about 10-205 distal narrowing followed by a trifurcation vessel.    Left anterior descending (LAD): The ostium of the LAD has an eccentric, somewhat dynamic and hazy proximal stenosis of 70%.  The vessel then opens up, then the stented area is widely patent. DIstally there may be mildly visible the area that was previously occluded, although there is not vigorous flow to the  apex.     Left circumflex (LCx): There is a ramus intermedius that bifurcates, then provides smaller branches distally.  There is mild irregularity proximally.   The distal vessel is smaller in caliber.  The AV circ provides a large marginal branch, with only mild irregularity.  There are some dynamic changes within the vessels.    Right coronary artery (RCA): The RCA is smaller in caliber, with 40-50% proximal narrowing.  There is 20% narrowing after an RV branch, the 50-60% narrowing at the bifurcation of the PDA, and PLA, both somewhat smaller vessels.  There is TIMI 3 flow.  Left ventriculography: Left ventricular systolic function is normal, LVEF is estimated at 55-65%, there is no significant mitral regurgitation (catheter related)  PCI Procedure Note:  Following the diagnostic procedure, the decision was made to proceed with PCI. The sheath was upsized to a 6 [place]. Weight-based bivalirudin was given for anticoagulation. Once a therapeutic ACT was achieved, a 6 [place] 3/0 XB guide catheter was inserted.  A traverse tip fashioned coronary guidewire was used to cross the lesion.  The lesion was not predilated with a  Balloon, but we used a balloon for length sizing.  The lesion was then stented with a 2.96mm by 16mm Promus DES stent.  The stent was postdilated with a 3.25 [place]  noncompliant balloon.  Following PCI, there was 0% residual stenosis and TIMI-3 flow. Final angiography confirmed an excellent result. The femoral sheath was secured into place with suture.  The patient tolerated the PCI procedure well. There were no immediate procedural complications.  The patient was transferred to the  post catheterization recovery area for further monitoring.  I reviewed the films with the patient and then her husband.    PCI Data: Vessel - LAD/Segment - 12 Percent Stenosis (pre)  75% TIMI-flow 3 Stent 2.75 by 16mm Promus Element Percent Stenosis (post) 0% TIMI-flow (post) 3  Final Conclusions:   1.   Continued patency of the LAD stent 2.  Hazy proximal LAD lesion as previously noted with progression from the previous study----successful PCI with DES 3.  Preserved LV  Recommendations:  1.  I remain concerned about the cause of her pain.  Favor a CT of the chest and repeat Echo before discharge to reassess her apical abnormality.  Importantly, the last echo was improved.  However, some of her symptoms still have characteristics of residual Dressler's.  She has been stable on plavix, so we felt it made sense to proceed with PCI of this lesion.    Follow up will be with me in the [facility].    Shawnie Pons, MD, [facility], [place] 4:12 PM 06/06/2011   Shawnie Pons 06/06/2011, 3:56 PM

## 2011-06-06 NOTE — H&P (View-Only) (Signed)
Addended by: Shawnie Pons D on: 06/05/2011 06:30 PM   Modules accepted: Orders

## 2011-06-06 NOTE — Progress Notes (Signed)
PM  Stable Groin ok. Will hold off on CT tomorrow, and check 2D echo.  May be able to go home tomorrow if ambulatory and without chest pain.  If chest pain persists, would lean toward doing CT of chest to assess pericardium.    Shawnie Pons 7:41 PM'06/06/2011

## 2011-06-07 ENCOUNTER — Other Ambulatory Visit: Payer: Self-pay

## 2011-06-07 DIAGNOSIS — I2510 Atherosclerotic heart disease of native coronary artery without angina pectoris: Principal | ICD-10-CM

## 2011-06-07 DIAGNOSIS — I241 Dressler's syndrome: Secondary | ICD-10-CM

## 2011-06-07 DIAGNOSIS — I059 Rheumatic mitral valve disease, unspecified: Secondary | ICD-10-CM

## 2011-06-07 DIAGNOSIS — E782 Mixed hyperlipidemia: Secondary | ICD-10-CM

## 2011-06-07 LAB — BASIC METABOLIC PANEL
Calcium: 8.6 mg/dL (ref 8.4–10.5)
Chloride: 108 mEq/L (ref 96–112)
Creatinine, Ser: 0.78 mg/dL (ref 0.50–1.10)
GFR calc Af Amer: >90 mL/min (ref >90)

## 2011-06-07 LAB — CBC
MCV: 83.6 fL (ref 78.0–100.0)
Platelets: 286 10*3/uL (ref 150–400)
RDW: 16.4 % — ABNORMAL HIGH (ref 11.5–15.5)
WBC: 7.9 10*3/uL (ref 4.0–10.5)

## 2011-06-07 LAB — HEPARIN LEVEL (UNFRACTIONATED): Heparin Unfractionated: 0.21 IU/mL — ABNORMAL LOW (ref 0.30–0.70)

## 2011-06-07 MED ORDER — WARFARIN - PHARMACIST DOSING INPATIENT
Freq: Every day | Status: DC
  Administered 2011-06-11: 17:00:00

## 2011-06-07 MED ORDER — HEPARIN (PORCINE) IN NACL 100-0.45 UNIT/ML-% IJ SOLN
1300.0000 [IU]/h | INTRAMUSCULAR | Status: DC
  Administered 2011-06-07: 1200 [IU]/h via INTRAVENOUS
  Administered 2011-06-08 – 2011-06-09 (×2): 1400 [IU]/h via INTRAVENOUS
  Filled 2011-06-07 (×9): qty 250

## 2011-06-07 MED ORDER — WARFARIN SODIUM 7.5 MG PO TABS
7.5000 mg | ORAL_TABLET | Freq: Once | ORAL | Status: AC
  Administered 2011-06-07: 7.5 mg via ORAL
  Filled 2011-06-07: qty 1

## 2011-06-07 NOTE — Progress Notes (Signed)
ANTICOAGULATION CONSULT NOTE - Initial Consult  Pharmacy Consult for heparin and coumadin Indication: LV mural thrombus  No Known Allergies  Patient Measurements: Height: 5\' 3"  (160 cm) Weight: 224 lb 13.9 oz (102 kg) IBW/kg (Calculated) : 52.4  Heparin Dosing Weight: 76.5kg   Vital Signs: Temp: 97 F (36.1 C) (04/06 1146) Temp src: Oral (04/06 1146) BP: 108/64 mmHg (04/06 1146) Pulse Rate: 77  (04/06 1146)  Labs:  Basename 06/07/11 0500 06/06/11 1103 06/06/11 0950  HGB 11.7* -- 12.9  HCT 37.6 -- 40.6  PLT 286 -- 321  APTT -- -- --  LABPROT -- -- 13.0  INR -- -- 0.96  HEPARINUNFRC -- -- --  CREATININE 0.78 -- 0.87  CKTOTAL -- 70 --  CKMB -- 1.2 --  TROPONINI -- <0.30 --   Estimated Creatinine Clearance: 98 ml/min (by C-G formula based on Cr of 0.78).  Medical History: Past Medical History  Diagnosis Date  . Anxiety   . Spinal stenosis   . Fibromyalgia   . Restless legs syndrome   . Tobacco abuse     reports quitting 12/2010  . Pericarditis     In setting of after anterior MI 03/2011  . Sleep apnea   . Coronary artery disease     A.  03/05/2011 - Ant MI - LAD stented w/ 2.75 x 16 mm Promus  DES - course complicated by Pericarditis, re-look cath ok. 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 GERD (gastroesophageal reflux disease)   . 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 Pleural effusion, left     Felt related to Dressler's s/p thoracentesis 03/27/11  . Myocardial infarction   . Angina   . Hypertension   . Shortness of breath     Medications:  Scheduled:    . aspirin  81 mg Oral Daily  . bivalirudin      . citalopram  10 mg Oral Daily  . clopidogrel  75 mg Oral Daily  . cyanocobalamin  500 mcg Oral Daily  . fentaNYL      . gabapentin  600 mg Oral TID  . heparin      . iron polysaccharides  150 mg Oral Daily  . lidocaine      . metoprolol tartrate  25 mg Oral BID  . midazolam      . midazolam      . Milnacipran  100 mg Oral BID  .  nitroGLYCERIN      . pantoprazole  40 mg Oral Q0600  . rOPINIRole  3 mg Oral QHS  . simvastatin  20 mg Oral q1800  . Vitamin D (Ergocalciferol)  50,000 Units Oral Q7 days  . DISCONTD: ferrous fumarate-iron polysaccharide complex  1 capsule Oral Weekly  . DISCONTD: sodium chloride  3 mL Intravenous Q12H   Infusions:    . sodium chloride 150 mL/hr at 06/06/11 1559  . DISCONTD: sodium chloride      Assessment: 51 yo female s/p cath is now found to have LV mural thrombus.  H/H 11.7/37.6; Plt 286. Coumadin score = 7; INR 0.96 yesterday.  Goal of Therapy:  Heparin level 0.3-0.7 units/ml; INR 2-3   Plan:  1) Start heparin drip at 1200 units/hr (=12 ml/hr). No bolus. 2) Coumadin 7.5mg  po x1 3) Check a 6hr heparin level after drip is started. 4) Daily heparin level and CBC  5) Daily PT/INR  Kristy Sloan, Kristy Sloan 06/07/2011,1:21 PM

## 2011-06-07 NOTE — Progress Notes (Signed)
  Echocardiogram 2D Echocardiogram has been performed.  Dahl Higinbotham, Real Cons 06/07/2011, 8:59 AM

## 2011-06-07 NOTE — Progress Notes (Signed)
See progress note from earlier this morning. Echocardiogram reviewed at the bedside. Also reviewed images from last echocardiogram in January. Patient now has evidence of an apical mural thrombus associated with an area of relative akinesis involving the distal septum and apex. This is seen best in off axis apical views. I reviewed the images with Dr. Shirlee Latch. This area was not noted on the previous echocardiogram, although there are fewer images to review from that study. Chronicity of this finding is not certain. It was not felt that further imaging with either Definity or cardiac MRI would necessarily better define this region. Discharge home is being canceled, as the patient will now be placed on heparin infusion with initiation of Coumadin. She is on  DAPT which will be continued for now. She will obviously require close followup as an outpatient. I discussed the situation with the patient and her husband  Jonelle Sidle, M.D., F.A.C.C.

## 2011-06-07 NOTE — Progress Notes (Signed)
Cardiac Rehab [phone] Pt has walked in hall independently,denies any cp or SOB. Reviewed discharge education with pt. She voices understanding. Pt declines Outpt. CRP, due to her chronic back pain, she feels that she can not do it.

## 2011-06-07 NOTE — Progress Notes (Signed)
ANTICOAGULATION CONSULT NOTE - Follow-up Consult  Pharmacy Consult for heparin  Indication: LV mural thrombus  No Known Allergies  Patient Measurements: Height: 5\' 3"  (160 cm) Weight: 224 lb 13.9 oz (102 kg) IBW/kg (Calculated) : 52.4  Heparin Dosing Weight: 76.5kg   Vital Signs: Temp: 97.1 F (36.2 C) (04/06 1944) Temp src: Oral (04/06 1944) BP: 117/62 mmHg (04/06 1944) Pulse Rate: 82  (04/06 1944)  Labs:  Basename 06/07/11 1954 06/07/11 0500 06/06/11 1103 06/06/11 0950  HGB -- 11.7* -- 12.9  HCT -- 37.6 -- 40.6  PLT -- 286 -- 321  APTT -- -- -- --  LABPROT -- -- -- 13.0  INR -- -- -- 0.96  HEPARINUNFRC 0.21* -- -- --  CREATININE -- 0.78 -- 0.87  CKTOTAL -- -- 70 --  CKMB -- -- 1.2 --  TROPONINI -- -- <0.30 --   Estimated Creatinine Clearance: 98 ml/min (by C-G formula based on Cr of 0.78).  Medical History: Past Medical History  Diagnosis Date  . Anxiety   . Spinal stenosis   . Fibromyalgia   . Restless legs syndrome   . Tobacco abuse     reports quitting 12/2010  . Pericarditis     In setting of after anterior MI 03/2011  . Sleep apnea   . Coronary artery disease     A.  03/05/2011 - Ant MI - LAD stented w/ 2.75 x 16 mm Promus  DES - course complicated by Pericarditis, re-look cath ok. 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 GERD (gastroesophageal reflux disease)   . 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 Pleural effusion, left     Felt related to Dressler's s/p thoracentesis 03/27/11  . Myocardial infarction   . Angina   . Hypertension   . Shortness of breath     Medications:  Scheduled:     . aspirin  81 mg Oral Daily  . citalopram  10 mg Oral Daily  . clopidogrel  75 mg Oral Daily  . cyanocobalamin  500 mcg Oral Daily  . gabapentin  600 mg Oral TID  . iron polysaccharides  150 mg Oral Daily  . metoprolol tartrate  25 mg Oral BID  . Milnacipran  100 mg Oral BID  . pantoprazole  40 mg Oral Q0600  . rOPINIRole  3 mg Oral QHS  .  simvastatin  20 mg Oral q1800  . Vitamin D (Ergocalciferol)  50,000 Units Oral Q7 days  . warfarin  7.5 mg Oral ONCE-1800  . Warfarin - Pharmacist Dosing Inpatient   Does not apply q1800   Infusions:     . sodium chloride 150 mL/hr at 06/06/11 1559  . heparin 1,200 Units/hr (06/07/11 1451)    Assessment: 51 yo female s/p cath is with LV mural thrombus and started on heparin/warfarin today.  Initial heparin level subtherapeutic.  No bleeding noted.   Goal of Therapy:  Heparin level 0.3-0.7 units/ml   Plan:  1) Increase heparin drip to 1400 units/hr  2) Reheck a 6hr heparin level after drip is started. 3) Discussed plan with Raynelle Fanning, RN  Elson Clan 06/07/2011,8:47 PM

## 2011-06-07 NOTE — Discharge Summary (Signed)
Patient ID: Kristy Sloan,  MRN: [id number], DOB/AGE: 05/29/1960 51 y.o.  Admit date: 06/06/2011 Discharge date: 06/13/2011  Primary Care Provider: Georgiann Hahn, MD Primary Cardiologist: T. Riley Kill, MD  Discharge Diagnoses Principal Problem:  *Unstable angina Active Problems:  Dressler's syndrome  Coronary artery disease  Mixed hyperlipidemia  LV (left ventricular) mural thrombus  Rash on back Allergies No Known Allergies  Procedures  Cardiac Catheterization and Percutaneous Coronary Intervention 06/05/2012  Left mainstem: There is about 10-205 distal narrowing followed by a trifurcation vessel.     Left anterior descending (LAD): The ostium of the LAD has an eccentric, somewhat dynamic and hazy proximal stenosis of 70%.  The vessel then opens up, then the stented area is widely patent. Distally there may be mildly visible the area that was previously occluded, although there is not vigorous flow to the apex.       **The lesion in the LAD stented with a 2.22mm by 16mm Promus DES stent**   Left circumflex (LCx): There is a ramus intermedius that bifurcates, then provides smaller branches distally.  There is mild irregularity proximally.   The distal vessel is smaller in caliber.  The AV circ provides a large marginal branch, with only mild irregularity.  There are some dynamic changes within the vessels.     Right coronary artery (RCA): The RCA is smaller in caliber, with 40-50% proximal narrowing.  There is 20% narrowing after an RV branch, the 50-60% narrowing at the bifurcation of the PDA, and PLA, both somewhat smaller vessels.  There is TIMI 3 flow.  Left ventriculography: Left ventricular systolic function is normal, LVEF is estimated at 55-65%, there is no significant mitral regurgitation (catheter related) _____________  2D Echocardiogram 06/07/2011 Study Conclusions  - Left ventricle: The cavity size was normal. Wall thickness   was normal. The estimated ejection  fraction was 55%. There   is akinesis of the distal anteroseptal and apical   myocardium. There is evidence of a mural thrombus noted in   this region, measuring approximately 1 cm in largest   dimension. This is relatively well seen using off axis   apical views. Comparison with prior echocardiographic   studies finds no clear evidence of prior thrombus,   although fewer views are available to review from the   previous studies. Left ventricular diastolic function   parameters were normal. - Mitral valve: Mild regurgitation. - Left atrium: The atrium was mildly dilated. - Tricuspid valve: Trivial regurgitation. - Pulmonary arteries: PA peak pressure: 29mm Hg (S). - Pericardium, extracardiac: There was no pericardial   effusion. _____________  CT Angio of Chest with Contrast 06/10/2011  IMPRESSION: 1.  Technically adequate study without pulmonary embolus. 2. Coronary artery stent. 3.  Mild cardiomegaly. 4.  Basilar atelectasis  History of Present Illness  51 y/o female with the above complex problem list.  She was admitted in January of this year with Anterior MI and subsequently underwent LAD stenting with a DES.  Post-procedure course was complicated by ongoing chest pain consistent with Dressler's syndrome and pericarditis.  Pt required readmission in mid-January 2/2 ongoing c/p and left pleural effusion, which required thoracentesis.  She recently f/u with Dr. Riley Kill in clinic on 4/4 and reported recurrent exertional as well as pleuritic chest discomfort.  Decision was made to admit and pursue repeat catheterization.  Hospital Course  Following admission, pt had recurrent chest pain w/o acute st/t changes on ECG.  She was taken to the cath lab on 4/5 and underwent  diagnostic cath revealing a hazy, 70% stenosis in the prox LAD.  The previously placed LAD stent was patent.  LV function was normal.  The proximal LAD was successfully stented with a Promus DES as outlined above.  There  was still some concern re: the etiology of the pts pleuritic Ss and an echocardiogram was performed on the AM of 4/6, revealing Nl LVEF with anteroseptal/apical akinesis and a LV apical thrombus.  This was a new finding.  She was placed on heparin and coumadin therapy. By 4/11, her INR was therapeutic and heparin was discontinued.  This AM, INR was supertherapeutic @ 3.19 and she was given a dose of Vit K 1mg .  F/U INR this afternoon is slightly higher @ 3.22.  We plan to d/c the patient this evening.  We will hold off on coumadin over the weekend and have arranged for f/u INR in our office on Monday 4/15.  Pending INR level @ that time we will advise her on the appropriate coumadin dose.  As we are holding her coumadin over the weekend, we will continue asa, in addition to plavix, for the time being.  Once she is a regular, therapeutic coumadin dose, we will plan to d/c asa (and continue plavix).  She is being discharged this evening in good condition.  Of note, on 4/10, pt noted a focal area of burning discomfort on her back with associated skin lesion.  She also complained of chest discomfort along her rib border.  There was concern that this may represent shingles.  Infectious dzs was consulted and felt that this was more likely a dermatitis that zoster reactivation.  Sedimentation rate (23) and CRP (.70) were mildly elevated.  HIV antibody, Quantiferon tb gold assay, cryoglobulin, hepatiti panel, cmv, Epstein-Barr antiobody panel, and spep were evaluated w/o significant findings.  Her lesion has been treated locally with neosporin.  Discharge Vitals Blood pressure 113/69, pulse 93, temperature 97.6 F (36.4 C), temperature source Oral, resp. rate 18, height 5\' 3"  (1.6 m), weight 224 lb 3.3 oz (101.7 kg), last menstrual period 05/13/2011, SpO2 97.00%.  Filed Weights   06/06/11 0930 06/07/11 0453 06/11/11 0546  Weight: 220 lb (99.791 kg) 224 lb 13.9 oz (102 kg) 224 lb 3.3 oz (101.7 kg)    Labs  CBC  Basename 06/13/11 0500 06/12/11 0640  WBC 6.6 5.9  NEUTROABS -- --  HGB 11.8* 12.2  HCT 38.1 39.7  MCV 83.9 85.2  PLT 268 294   Basic Metabolic Panel  Basename 06/11/11 0515  NA 140  K 3.8  CL 102  CO2 31  GLUCOSE 105*  BUN 13  CREATININE 0.83  CALCIUM 9.1  MG --  PHOS --   Liver Function Tests  Basename 06/11/11 0515  AST 19  ALT 23  ALKPHOS 68  BILITOT 0.1*  PROT 6.6  ALBUMIN 3.3*   Cardiac Enzymes  Basename 06/10/11 1932  CKTOTAL 47  CKMB 1.3  CKMBINDEX --  TROPONINI <0.30   Disposition  Pt is being discharged home today in good condition.  Follow-up Plans & Appointments  Follow-up Information    Follow up with Shawnie Pons, MD in 1 week. (we will arrange follow-up)    Contact information:   [address] [place] [address] [phone]       Follow up with [facility] on 06/16/2011. (Coumadin check - present to our lab after 9 AM)    Contact information:   [phone]  Discharge Medications  Medication List  As of 06/13/2011  6:45 PM   TAKE these medications         aspirin 81 MG EC tablet   Take 1 tablet (81 mg total) by mouth daily.      citalopram 10 MG tablet   Commonly known as: CELEXA   Take 10 mg by mouth daily.      clopidogrel 75 MG tablet   Commonly known as: PLAVIX   Take 75 mg by mouth daily.      cyanocobalamin 500 MCG tablet   Take 500 mcg by mouth daily.      cyanocobalamin 1000 MCG/ML injection   Commonly known as: (VITAMIN B-12)   Inject 1,000 mcg into the muscle every 7 (seven) days. Takes on Saturday      DOC-Q-LACE PO   Take 100 mg by mouth as needed.      ferrous fumarate-iron polysaccharide complex 162-115.2 MG Caps   Commonly known as: TANDEM   Take 1 capsule by mouth once a week. Take on wednesday      gabapentin 300 MG capsule   Commonly known as: NEURONTIN   Take 600 mg by mouth 3 (three) times daily.       HYDROcodone-acetaminophen 10-500 MG per tablet   Commonly known as: LORTAB   Take 1 tablet by mouth every 4 (four) hours as needed. For pain      metoprolol tartrate 25 MG tablet   Commonly known as: LOPRESSOR   Take 25 mg by mouth 2 (two) times daily.      Milnacipran 50 MG Tabs   Commonly known as: SAVELLA   Take 100 mg by mouth 2 (two) times daily.      neomycin-bacitracin-polymyxin Oint   Commonly known as: NEOSPORIN   Apply 1 application topically as needed (for irritated scratch on back).      nitroGLYCERIN 0.4 MG SL tablet   Commonly known as: NITROSTAT   Place 0.4 mg under the tongue every 5 (five) minutes as needed. For chest pain      pantoprazole 40 MG tablet   Commonly known as: PROTONIX   Take 40 mg by mouth daily at 6 (six) AM.      rOPINIRole 3 MG tablet   Commonly known as: REQUIP   Take 3 mg by mouth at bedtime.      simvastatin 20 MG tablet   Commonly known as: ZOCOR   Take 20 mg by mouth daily at 6 PM.      tiZANidine 4 MG tablet   Commonly known as: ZANAFLEX   Take 4 mg by mouth every 6 (six) hours as needed. 1 and 1/2 tablet Three times daily      Vitamin D (Ergocalciferol) 50000 UNITS Caps   Commonly known as: DRISDOL   Take 50,000 Units by mouth every 7 (seven) days. Take on wednesday      warfarin 5 MG tablet - **NOT TO BE STARTED UNTIL AFTER F/U INR 4/15, AS DIRECTED.**   Commonly known as: COUMADIN   Take 1 tablet (5 mg total) by mouth daily.      zolpidem 10 MG tablet   Commonly known as: AMBIEN   Take 10 mg by mouth at bedtime as needed. For sleep           Outstanding Labs/Studies  Follow-up INR on 4/15.  Duration of Discharge Encounter   Greater than 30 minutes including physician time.  Signed, Nicolasa Ducking NP 06/13/2011, 6:45 PM

## 2011-06-07 NOTE — Progress Notes (Signed)
   Subjective: No chest pain. Has been up in the hall walking, feels well. Breathing has been better.   Objective: Temp:  [97.4 F (36.3 C)-98.4 F (36.9 C)] 97.4 F (36.3 C) (04/06 0453) Pulse Rate:  [63-95] 81  (04/06 0453) Resp:  [16-21] 20  (04/06 0453) BP: (100-124)/(50-78) 103/64 mmHg (04/06 0453) SpO2:  [96 %-98 %] 96 % (04/06 0453) Weight:  [220 lb (99.791 kg)-224 lb 13.9 oz (102 kg)] 224 lb 13.9 oz (102 kg) (04/06 0453)  I/O last 3 completed shifts: In: 840 [P.O.:840] Out: 300 [Urine:300]  Telemetry - Sinus rhythm.  Exam -   General - NAD.  Lungs - Clear, nonlabored.  Cardiac - Regular rate and rhythm, no rub.  Abdomen - NABS.  Extremities - No hematoma.  Testing -   Lab Results  Component Value Date   WBC 7.9 06/07/2011   HGB 11.7* 06/07/2011   HCT 37.6 06/07/2011   MCV 83.6 06/07/2011   PLT 286 06/07/2011    Lab Results  Component Value Date   CREATININE 0.78 06/07/2011   BUN 11 06/07/2011   NA 141 06/07/2011   K 4.0 06/07/2011   CL 108 06/07/2011   CO2 24 06/07/2011    Lab Results  Component Value Date   CKTOTAL 70 06/06/2011   CKMB 1.2 06/06/2011   TROPONINI <0.30 06/06/2011    ECG - Sinus rhythm with anterior ST-T wave changes as before.   Current Medications    . aspirin  324 mg Oral Pre-Cath  . aspirin  81 mg Oral Daily  . bivalirudin      . citalopram  10 mg Oral Daily  . clopidogrel      . clopidogrel  75 mg Oral Daily  . cyanocobalamin  500 mcg Oral Daily  . diazepam  5 mg Oral On Call  . fentaNYL      . gabapentin  600 mg Oral TID  . heparin      . iron polysaccharides  150 mg Oral Daily  . lidocaine      . metoprolol tartrate  25 mg Oral BID  . midazolam      . midazolam      . Milnacipran  100 mg Oral BID  . nitroGLYCERIN      . pantoprazole  40 mg Oral Q0600  . rOPINIRole  3 mg Oral QHS  . simvastatin  20 mg Oral q1800  . Vitamin D (Ergocalciferol)  50,000 Units Oral Q7 days  . DISCONTD: ferrous fumarate-iron polysaccharide complex   1 capsule Oral Weekly  . DISCONTD: sodium chloride  3 mL Intravenous Q12H    Assessment:  1. CAD, now status post placement of DES to the proximal LAD by Dr. Riley Kill on the 4/5. Previously stented segment widely patent. LVEF 55-65% estimated at angiography.  2. History of Dressler's syndrome, pericarditis and also pleural effusion that required thoracentesis.  3. History of tobacco abuse, has quit smoking over the last several months.  4. Hyperlipidemia, on statin therapy.  Plan:  Reviewed Dr. Rosalyn Charters plan for followup echocardiogram this morning. Patient is clinically stable otherwise. Anticipate discharge home today with close followup in the office, unless her echocardiogram shows progressive/worsening findings that would require further evaluation as an inpatient. She should continue medical therapy including DAPT. Would recommend followup CBC for office visit with Dr. Riley Kill or Mr. Alben Spittle in the next 7-10 days.   Jonelle Sidle, M.D., F.A.C.C.

## 2011-06-08 ENCOUNTER — Other Ambulatory Visit: Payer: Self-pay

## 2011-06-08 DIAGNOSIS — I513 Intracardiac thrombosis, not elsewhere classified: Secondary | ICD-10-CM

## 2011-06-08 DIAGNOSIS — I219 Acute myocardial infarction, unspecified: Secondary | ICD-10-CM

## 2011-06-08 LAB — CBC
MCHC: 31.4 g/dL (ref 30.0–36.0)
RDW: 16 % — ABNORMAL HIGH (ref 11.5–15.5)

## 2011-06-08 LAB — BASIC METABOLIC PANEL
GFR calc Af Amer: >90 mL/min (ref >90)
GFR calc non Af Amer: >90 mL/min (ref >90)
Potassium: 3.9 mEq/L (ref 3.5–5.1)
Sodium: 139 mEq/L (ref 135–145)

## 2011-06-08 LAB — PROTIME-INR: INR: 1.02 (ref 0.00–1.49)

## 2011-06-08 LAB — HEPARIN LEVEL (UNFRACTIONATED): Heparin Unfractionated: 0.47 IU/mL (ref 0.30–0.70)

## 2011-06-08 MED ORDER — WARFARIN SODIUM 7.5 MG PO TABS
7.5000 mg | ORAL_TABLET | Freq: Once | ORAL | Status: AC
  Administered 2011-06-08: 7.5 mg via ORAL
  Filled 2011-06-08: qty 1

## 2011-06-08 MED ORDER — WARFARIN VIDEO
Freq: Once | Status: AC
  Administered 2011-06-08: 11:00:00

## 2011-06-08 MED ORDER — COUMADIN BOOK
Freq: Once | Status: AC
  Administered 2011-06-08: 11:00:00
  Filled 2011-06-08: qty 1

## 2011-06-08 NOTE — Progress Notes (Signed)
Subjective: No chest pain or breathlessness. Has had some dizziness with standing. No abdominal or back pain, no obvious bleeding.   Objective: Temp:  [97 F (36.1 C)-97.3 F (36.3 C)] 97.3 F (36.3 C) (04/07 0511) Pulse Rate:  [75-82] 77  (04/07 0511) Resp:  [17-20] 17  (04/07 0511) BP: (88-124)/(56-80) 103/64 mmHg (04/07 0753) SpO2:  [94 %-98 %] 98 % (04/07 0511)  I/O last 3 completed shifts: In: 960 [P.O.:960] Out: -   Telemetry - Normal sinus rhythm.  Exam -   General - NAD.   Lungs - Clear, nonlabored.   Cardiac - Regular rate and rhythm, no rub.   Abdomen - NABS.   Extremities - No hematoma.  Testing -   Lab Results  Component Value Date   WBC 6.3 06/08/2011   HGB 11.7* 06/08/2011   HCT 37.3 06/08/2011   MCV 82.7 06/08/2011   PLT 256 06/08/2011    Lab Results  Component Value Date   CREATININE 0.78 06/08/2011   BUN 10 06/08/2011   NA 139 06/08/2011   K 3.9 06/08/2011   CL 105 06/08/2011   CO2 26 06/08/2011    Lab Results  Component Value Date   CKTOTAL 70 06/06/2011   CKMB 1.2 06/06/2011   TROPONINI <0.30 06/06/2011    Echocardiogram 4/6 Study Conclusions  - Left ventricle: The cavity size was normal. Wall thickness was normal. The estimated ejection fraction was 55%. There is akinesis of the distal anteroseptal and apical myocardium. There is evidence of a mural thrombus noted in this region, measuring approximately 1 cm in largest dimension. This is relatively well seen using off axis apical views. Comparison with prior echocardiographic studies finds no clear evidence of prior thrombus, although fewer views are available to review from the previous studies. Left ventricular diastolic function parameters were normal. - Mitral valve: Mild regurgitation. - Left atrium: The atrium was mildly dilated. - Tricuspid valve: Trivial regurgitation. - Pulmonary arteries: PA peak pressure: 29mm Hg (S). - Pericardium, extracardiac: There was no  pericardial effusion.  Current Medications    . aspirin  81 mg Oral Daily  . citalopram  10 mg Oral Daily  . clopidogrel  75 mg Oral Daily  . cyanocobalamin  500 mcg Oral Daily  . gabapentin  600 mg Oral TID  . iron polysaccharides  150 mg Oral Daily  . metoprolol tartrate  25 mg Oral BID  . Milnacipran  100 mg Oral BID  . pantoprazole  40 mg Oral Q0600  . rOPINIRole  3 mg Oral QHS  . simvastatin  20 mg Oral q1800  . Vitamin D (Ergocalciferol)  50,000 Units Oral Q7 days  . warfarin  7.5 mg Oral ONCE-1800  . warfarin  7.5 mg Oral ONCE-1800  . Warfarin - Pharmacist Dosing Inpatient   Does not apply q1800     Assessment:  1. CAD, now status post placement of DES to the proximal LAD by Dr. Riley Kill on the 4/5. Previously stented segment widely patent. LVEF 55-65% estimated at angiography.   2. LV mural thrombus at apex, seen on echocardiogram from yesterday, duration not entirely clear, but not obvious based on review of prior study from January. Now on heparin and initiation of Coumadin. DAPT has not been interrupted in light of recent DES.  3. History of Dressler's syndrome, pericarditis and also pleural effusion that required thoracentesis.   4. History of tobacco abuse, has quit smoking over the last several months.   5. Hyperlipidemia, on statin  therapy.  Plan:  Continue current regimen. She is on DAPT (just had DES), also heparin during initiation of Coumadin given LV mural thrombus. Bleeding risk will be increased, and we did discuss this. No chest pain, and rhythm is stable. Watch blood pressure, can modify medications if necessary. Her hemoglobin is stable and there is no obvious bleeding at this time.   Jonelle Sidle, M.D., F.A.C.C.

## 2011-06-08 NOTE — Progress Notes (Signed)
ANTICOAGULATION CONSULT NOTE - Follow-up Consult  Pharmacy Consult for heparin  Indication: LV mural thrombus  No Known Allergies  Patient Measurements: Height: 5\' 3"  (160 cm) Weight: 224 lb 13.9 oz (102 kg) IBW/kg (Calculated) : 52.4  Heparin Dosing Weight: 76.5kg   Vital Signs: Temp: 97.1 F (36.2 C) (04/06 1944) Temp src: Oral (04/06 1944) BP: 117/62 mmHg (04/06 1944) Pulse Rate: 82  (04/06 1944)  Labs:  Basename 06/08/11 0224 06/07/11 1954 06/07/11 0500 06/06/11 1103 06/06/11 0950  HGB 11.7* -- 11.7* -- --  HCT 37.3 -- 37.6 -- 40.6  PLT 256 -- 286 -- 321  APTT -- -- -- -- --  LABPROT 13.6 -- -- -- 13.0  INR 1.02 -- -- -- 0.96  HEPARINUNFRC 0.47 0.21* -- -- --  CREATININE 0.78 -- 0.78 -- 0.87  CKTOTAL -- -- -- 70 --  CKMB -- -- -- 1.2 --  TROPONINI -- -- -- <0.30 --   Estimated Creatinine Clearance: 98 ml/min (by C-G formula based on Cr of 0.78).  Medical History: Past Medical History  Diagnosis Date  . Anxiety   . Spinal stenosis   . Fibromyalgia   . Restless legs syndrome   . Tobacco abuse     reports quitting 12/2010  . Pericarditis     In setting of after anterior MI 03/2011  . Sleep apnea   . Coronary artery disease     A.  03/05/2011 - Ant MI - LAD stented w/ 2.75 x 16 mm Promus  DES - course complicated by Pericarditis, re-look cath ok. 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 GERD (gastroesophageal reflux disease)   . 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 Pleural effusion, left     Felt related to Dressler's s/p thoracentesis 03/27/11  . Myocardial infarction   . Angina   . Hypertension   . Shortness of breath     Medications:  Scheduled:     . aspirin  81 mg Oral Daily  . citalopram  10 mg Oral Daily  . clopidogrel  75 mg Oral Daily  . cyanocobalamin  500 mcg Oral Daily  . gabapentin  600 mg Oral TID  . iron polysaccharides  150 mg Oral Daily  . metoprolol tartrate  25 mg Oral BID  . Milnacipran  100 mg Oral BID  .  pantoprazole  40 mg Oral Q0600  . rOPINIRole  3 mg Oral QHS  . simvastatin  20 mg Oral q1800  . Vitamin D (Ergocalciferol)  50,000 Units Oral Q7 days  . warfarin  7.5 mg Oral ONCE-1800  . Warfarin - Pharmacist Dosing Inpatient   Does not apply q1800   Infusions:     . heparin 1,400 Units/hr (06/07/11 2107)    Assessment: 51 yo female s/p cath is with LV mural thrombus and on D2 heparin/warfarin.  Heparin level is at-goal. INR is below-goal.    Goal of Therapy:  Heparin level 0.3-0.7 units/ml   Plan:  1. Continue IV heparin at 1400 units/hr.  2. Confirmatory heparin level in 6 hours.  3. Coumadin 7.5mg  po today.   Emeline Gins 06/08/2011,3:26 AM

## 2011-06-08 NOTE — Progress Notes (Signed)
ANTICOAGULATION CONSULT NOTE - Follow-up Consult  Pharmacy Consult for heparin  Indication: LV mural thrombus  No Known Allergies  Patient Measurements: Height: 5\' 3"  (160 cm) Weight: 224 lb 13.9 oz (102 kg) IBW/kg (Calculated) : 52.4  Heparin Dosing Weight: 76.5kg   Vital Signs: Temp: 97.3 F (36.3 C) (04/07 0511) Temp src: Oral (04/07 0511) BP: 98/64 mmHg (04/07 1000) Pulse Rate: 77  (04/07 0511)  Labs:  Basename 06/08/11 0815 06/08/11 0224 06/07/11 1954 06/07/11 0500 06/06/11 1103 06/06/11 0950  HGB -- 11.7* -- 11.7* -- --  HCT -- 37.3 -- 37.6 -- 40.6  PLT -- 256 -- 286 -- 321  APTT -- -- -- -- -- --  LABPROT -- 13.6 -- -- -- 13.0  INR -- 1.02 -- -- -- 0.96  HEPARINUNFRC 0.59 0.47 0.21* -- -- --  CREATININE -- 0.78 -- 0.78 -- 0.87  CKTOTAL -- -- -- -- 70 --  CKMB -- -- -- -- 1.2 --  TROPONINI -- -- -- -- <0.30 --   Estimated Creatinine Clearance: 98 ml/min (by C-G formula based on Cr of 0.78).  Medical History: Past Medical History  Diagnosis Date  . Anxiety   . Spinal stenosis   . Fibromyalgia   . Restless legs syndrome   . Tobacco abuse     reports quitting 12/2010  . Pericarditis     In setting of after anterior MI 03/2011  . Sleep apnea   . Coronary artery disease     A.  03/05/2011 - Ant MI - LAD stented w/ 2.75 x 16 mm Promus  DES - course complicated by Pericarditis, re-look cath ok. 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 GERD (gastroesophageal reflux disease)   . 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 Pleural effusion, left     Felt related to Dressler's s/p thoracentesis 03/27/11  . Myocardial infarction   . Angina   . Hypertension   . Shortness of breath     Medications:  Scheduled:     . aspirin  81 mg Oral Daily  . citalopram  10 mg Oral Daily  . clopidogrel  75 mg Oral Daily  . coumadin book   Does not apply Once  . cyanocobalamin  500 mcg Oral Daily  . gabapentin  600 mg Oral TID  . iron polysaccharides  150 mg Oral  Daily  . metoprolol tartrate  25 mg Oral BID  . Milnacipran  100 mg Oral BID  . pantoprazole  40 mg Oral Q0600  . rOPINIRole  3 mg Oral QHS  . simvastatin  20 mg Oral q1800  . Vitamin D (Ergocalciferol)  50,000 Units Oral Q7 days  . warfarin  7.5 mg Oral ONCE-1800  . warfarin  7.5 mg Oral ONCE-1800  . warfarin   Does not apply Once  . Warfarin - Pharmacist Dosing Inpatient   Does not apply q1800   Infusions:     . heparin 1,400 Units/hr (06/08/11 1017)    Assessment: 51 yo female s/p cath is with LV mural thrombus on D2 heparin/warfarin.  Heparin level is at-goal x2. No bleeding noted.    Goal of Therapy:  Heparin level 0.3-0.7 units/ml   Plan:  1. Continue IV heparin at 1400 units/hr.  2. Follow-up daily labs. 3. Initiate warfarin education.  Elson Clan 06/08/2011,10:21 AM

## 2011-06-09 ENCOUNTER — Other Ambulatory Visit: Payer: Self-pay

## 2011-06-09 LAB — BASIC METABOLIC PANEL
BUN: 11 mg/dL (ref 6–23)
Calcium: 8.9 mg/dL (ref 8.4–10.5)
Creatinine, Ser: 0.82 mg/dL (ref 0.50–1.10)
GFR calc Af Amer: >90 mL/min (ref >90)
GFR calc non Af Amer: 83 mL/min — ABNORMAL LOW (ref >90)
Glucose, Bld: 98 mg/dL (ref 70–99)
Potassium: 4.2 mEq/L (ref 3.5–5.1)

## 2011-06-09 LAB — CBC
HCT: 38 % (ref 36.0–46.0)
MCH: 25.9 pg — ABNORMAL LOW (ref 26.0–34.0)
MCHC: 30.8 g/dL (ref 30.0–36.0)
MCV: 84.3 fL (ref 78.0–100.0)
Platelets: 277 10*3/uL (ref 150–400)
RDW: 16 % — ABNORMAL HIGH (ref 11.5–15.5)

## 2011-06-09 MED ORDER — WARFARIN SODIUM 7.5 MG PO TABS
7.5000 mg | ORAL_TABLET | Freq: Once | ORAL | Status: AC
  Administered 2011-06-09: 7.5 mg via ORAL
  Filled 2011-06-09: qty 1

## 2011-06-09 MED ORDER — BACITRACIN-NEOMYCIN-POLYMYXIN OINTMENT TUBE
TOPICAL_OINTMENT | CUTANEOUS | Status: DC | PRN
  Filled 2011-06-09: qty 15

## 2011-06-09 MED ORDER — BACITRACIN-NEOMYCIN-POLYMYXIN OINTMENT TUBE
TOPICAL_OINTMENT | Freq: Two times a day (BID) | CUTANEOUS | Status: DC
  Administered 2011-06-09 – 2011-06-10 (×2): via TOPICAL
  Administered 2011-06-10: 1 via TOPICAL
  Administered 2011-06-11 – 2011-06-13 (×5): via TOPICAL
  Filled 2011-06-09: qty 15

## 2011-06-09 NOTE — Progress Notes (Signed)
CARDIAC REHAB PHASE I   PRE:  Rate/Rhythm: 87SR  BP:  Supine: 112/70  Sitting:   Standing:    SaO2: 97%RA  MODE:  Ambulation: 550 ft   POST:  Rate/Rhythem: 107  BP:  Supine:   Sitting: 104/70  Standing:    SaO2: 97%RA 0957-1020 Pt walked 550 ft on RA with rolling walker and asst x 1. Gait steady. Pt c/o dizziness during walk. Stated it felt like she could not keep her balance. To recliner after walk. Instructed pt not to get up by herself. Call bell in reach.  Duanne Limerick

## 2011-06-09 NOTE — Progress Notes (Signed)
Subjective:  Stable at present. Events of weekend reviewed.  No current problems.  Notices discomfort only with deep breath.  New itchy lesion on back today.    Objective:  Vital Signs in the last 24 hours: Temp:  [97.5 F (36.4 C)-98.2 F (36.8 C)] 98.2 F (36.8 C) (04/08 0826) Pulse Rate:  [76-92] 92  (04/08 0826) Resp:  [18] 18  (04/08 0407) BP: (114-125)/(58-80) 125/80 mmHg (04/08 0826) SpO2:  [93 %-98 %] 96 % (04/08 0826)  Intake/Output from previous day: 04/07 0701 - 04/08 0700 In: 1008 [P.O.:720; I.V.:288] Out: 1250 [Urine:1250]   Physical Exam: General: Well developed, well nourished, in no acute distress. Head:  Normocephalic and atraumatic. Lungs: Clear to auscultation and percussion. Heart: Normal S1 and S2.  No murmur, rubs or gallops.  Pulses: Pulses normal in all 4 extremities. Extremities: No clubbing or cyanosis. No edema. Neurologic: Alert and oriented x 3. Skin:  Small erythematous lesion on back. Less than 1cm  Flat.      Lab Results:  Basename 06/09/11 0535 06/08/11 0224  WBC 6.4 6.3  HGB 11.7* 11.7*  PLT 277 256    Basename 06/09/11 0535 06/08/11 0224  NA 141 139  K 4.2 3.9  CL 106 105  CO2 30 26  GLUCOSE 98 97  BUN 11 10  CREATININE 0.82 0.78   No results found for this basename: TROPONINI:2,CK,MB:2 in the last 72 hours Hepatic Function Panel No results found for this basename: PROT,ALBUMIN,AST,ALT,ALKPHOS,BILITOT,BILIDIR,IBILI in the last 72 hours No results found for this basename: CHOL in the last 72 hours No results found for this basename: PROTIME in the last 72 hours  Imaging: No results found.    Assessment/Plan:  Patient Active Hospital Problem List: CAD   SP repeat cath with PCI of proximal LAD with DES.  Stable.  PRU normal on Plavix LV (left ventricular) mural thrombus (06/08/2011)   Assessment: will aim for goal of 1.7-2.2, and treat for 3- 4 months   Plan: likely combo of plavix and warfarin without ASA  ([place])  Will  use neosporin for skin lesion, although not sure of source.         Shawnie Pons, MD, [facility], FSCAI 06/09/2011, 2:16 PM

## 2011-06-09 NOTE — Progress Notes (Signed)
ANTICOAGULATION CONSULT NOTE - Follow Up Consult  Pharmacy Consult for Heparin Indication: LV mural thrombus   No Known Allergies  Vital Signs: Temp: 98.1 F (36.7 C) (04/08 1330) Temp src: Oral (04/08 1330) BP: 100/62 mmHg (04/08 1330) Pulse Rate: 82  (04/08 1330)  Labs:  Basename 06/09/11 1704 06/09/11 0535 06/08/11 0815 06/08/11 0224 06/07/11 0500  HGB -- 11.7* -- 11.7* --  HCT -- 38.0 -- 37.3 37.6  PLT -- 277 -- 256 286  APTT -- -- -- -- --  LABPROT -- 14.4 -- 13.6 --  INR -- 1.10 -- 1.02 --  HEPARINUNFRC 0.51 0.62 0.59 -- --  CREATININE -- 0.82 -- 0.78 0.78  CKTOTAL -- -- -- -- --  CKMB -- -- -- -- --  TROPONINI -- -- -- -- --   Estimated Creatinine Clearance: 95.6 ml/min (by C-G formula based on Cr of 0.82).  Medications:  Heparin @ 1300 units/hr  Assessment: 50yof continues on heparin with a heparin level that is now therapeutic after rate decreased earlier today for new lower goal.  Goal of Therapy:  Heparin level 0.3-0.5   Plan:  1) Continue heparin at 1300 units/hr 2) Follow up heparin level in AM  Fredrik Rigger 06/09/2011,6:18 PM

## 2011-06-09 NOTE — Progress Notes (Signed)
ANTICOAGULATION CONSULT NOTE - Follow-up Consult  Pharmacy Consult for heparin>>warfarin Indication: LV mural thrombus  No Known Allergies  Patient Measurements: Height: 5\' 3"  (160 cm) Weight: 224 lb 13.9 oz (102 kg) IBW/kg (Calculated) : 52.4  Heparin Dosing Weight: 76.5kg   Vital Signs: Temp: 98.2 F (36.8 C) (04/08 0826) Temp src: Oral (04/08 0826) BP: 125/80 mmHg (04/08 0826) Pulse Rate: 92  (04/08 0826)  Labs:  Alvira Philips 06/09/11 0535 06/08/11 0815 06/08/11 0224 06/07/11 0500 06/06/11 1103 06/06/11 0950  HGB 11.7* -- 11.7* -- -- --  HCT 38.0 -- 37.3 37.6 -- --  PLT 277 -- 256 286 -- --  APTT -- -- -- -- -- --  LABPROT 14.4 -- 13.6 -- -- 13.0  INR 1.10 -- 1.02 -- -- 0.96  HEPARINUNFRC 0.62 0.59 0.47 -- -- --  CREATININE 0.82 -- 0.78 0.78 -- --  CKTOTAL -- -- -- -- 70 --  CKMB -- -- -- -- 1.2 --  TROPONINI -- -- -- -- <0.30 --   Estimated Creatinine Clearance: 95.6 ml/min (by C-G formula based on Cr of 0.82).  Medical History: Past Medical History  Diagnosis Date  . Anxiety   . Spinal stenosis   . Fibromyalgia   . Restless legs syndrome   . Tobacco abuse     reports quitting 12/2010  . Pericarditis     In setting of after anterior MI 03/2011  . Sleep apnea   . Coronary artery disease     A.  03/05/2011 - Ant MI - LAD stented w/ 2.75 x 16 mm Promus  DES - course complicated by Pericarditis, re-look cath ok. 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 GERD (gastroesophageal reflux disease)   . 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 Pleural effusion, left     Felt related to Dressler's s/p thoracentesis 03/27/11  . Myocardial infarction   . Angina   . Hypertension   . Shortness of breath     Medications:  Scheduled:     . aspirin  81 mg Oral Daily  . citalopram  10 mg Oral Daily  . clopidogrel  75 mg Oral Daily  . coumadin book   Does not apply Once  . cyanocobalamin  500 mcg Oral Daily  . gabapentin  600 mg Oral TID  . iron polysaccharides   150 mg Oral Daily  . metoprolol tartrate  25 mg Oral BID  . Milnacipran  100 mg Oral BID  . pantoprazole  40 mg Oral Q0600  . rOPINIRole  3 mg Oral QHS  . simvastatin  20 mg Oral q1800  . Vitamin D (Ergocalciferol)  50,000 Units Oral Q7 days  . warfarin  7.5 mg Oral ONCE-1800  . warfarin   Does not apply Once  . Warfarin - Pharmacist Dosing Inpatient   Does not apply q1800   Infusions:     . heparin 1,400 Units/hr (06/09/11 0537)    Assessment: 51 yo female s/p cath is with LV mural thrombus on D3 heparin/warfarin.  Heparin level 0.6 this am, per cardiology will aim for lower goal, rate adjustment this morning will be needed. No bleeding has been noted. INR very slowly rising, but still below goal.  Goal of Therapy:  Heparin level 0.3-0.5 requested by MD   Plan:  1.Decrease heparin to 1300 units/hr 2.Recheck HL this afternoon 3.Repeat warfarin 7.5 tonight  Severiano Gilbert 06/09/2011,8:36 AM

## 2011-06-10 ENCOUNTER — Inpatient Hospital Stay (HOSPITAL_COMMUNITY): Payer: Self-pay

## 2011-06-10 ENCOUNTER — Encounter (HOSPITAL_COMMUNITY): Payer: Self-pay | Admitting: Physician Assistant

## 2011-06-10 LAB — BASIC METABOLIC PANEL
BUN: 12 mg/dL (ref 6–23)
CO2: 28 mEq/L (ref 19–32)
Calcium: 9 mg/dL (ref 8.4–10.5)
Glucose, Bld: 98 mg/dL (ref 70–99)
Sodium: 140 mEq/L (ref 135–145)

## 2011-06-10 LAB — CARDIAC PANEL(CRET KIN+CKTOT+MB+TROPI)
CK, MB: 1.3 ng/mL (ref 0.3–4.0)
Troponin I: <0.3 ng/mL (ref <0.30)

## 2011-06-10 LAB — CBC
HCT: 37.7 % (ref 36.0–46.0)
Hemoglobin: 11.7 g/dL — ABNORMAL LOW (ref 12.0–15.0)
MCHC: 31 g/dL (ref 30.0–36.0)
MCV: 84.3 fL (ref 78.0–100.0)

## 2011-06-10 LAB — PROTIME-INR: INR: 1.55 — ABNORMAL HIGH (ref 0.00–1.49)

## 2011-06-10 MED ORDER — ONDANSETRON HCL 4 MG/2ML IJ SOLN: 4.0000 mg | Freq: Four times a day (QID) | INTRAMUSCULAR | Status: DC | PRN

## 2011-06-10 MED ORDER — NAPROXEN 250 MG PO TABS
250.0000 mg | ORAL_TABLET | Freq: Two times a day (BID) | ORAL | Status: DC
  Administered 2011-06-11 – 2011-06-13 (×4): 250 mg via ORAL
  Filled 2011-06-10 (×6): qty 1

## 2011-06-10 MED ORDER — IOHEXOL 350 MG/ML SOLN
80.0000 mL | Freq: Once | INTRAVENOUS | Status: AC | PRN
  Administered 2011-06-10: 80 mL via INTRAVENOUS

## 2011-06-10 MED ORDER — MORPHINE SULFATE 2 MG/ML IJ SOLN
2.0000 mg | INTRAMUSCULAR | Status: DC | PRN
  Administered 2011-06-10 – 2011-06-13 (×15): 2 mg via INTRAVENOUS
  Administered 2011-06-13 (×2): 4 mg via INTRAVENOUS
  Filled 2011-06-10 (×9): qty 1
  Filled 2011-06-10 (×2): qty 2
  Filled 2011-06-10 (×5): qty 1
  Filled 2011-06-10: qty 2

## 2011-06-10 MED ORDER — WARFARIN SODIUM 7.5 MG PO TABS
7.5000 mg | ORAL_TABLET | Freq: Once | ORAL | Status: AC
  Administered 2011-06-10: 7.5 mg via ORAL
  Filled 2011-06-10: qty 1

## 2011-06-10 NOTE — Significant Event (Signed)
Called to see patient regarding chest pain, 8/10. She reports it has been on and off over the last hour, substernal with associated "arm heaviness." She also has some neck pain. Pain is worse when she takes a deep breath in. It is also made worse with deep palpation although she feels this is a different kind of pain. She says this is the same pain as her original heart attack but later relates it to her post-MI Dressler's syndrome so pain is vague. She has mild SOB. VSS except soft BP's mid 90's (she tends to run lower). She got Vicodin earlier without relief. EKG does not show any acute changes from earlier tracings this admission. I called Dr. Excell Seltzer to come see her as well who also saw the patient. Exam benign except patient winces with deep inspiration, also some CP to palpation as above. Per discussion with him, we will rx morphine 2-4mg  prn and send for CTA to rule out PE given unchanged EKG. He will be talking to Dr. Riley Kill who is currently in a case but knows the patient very well. Further plans to follow. Nurse made aware of plan.  Jahlon Baines PA-C

## 2011-06-10 NOTE — Progress Notes (Signed)
The patient is stable but still has some pain. CT is essentially negative for dissection, or PE.  She is tender in the left costochondral junction.  She has a spot on her back, the source of which is unclear.  ? Early shingles--doubt.  Acting more like costochondritis.    Kristy Sloan 7:19 PM 06/10/2011

## 2011-06-10 NOTE — Progress Notes (Signed)
Patient: Kristy Sloan Date of Encounter: 06/10/2011, 8:41 AM Admit date: 06/06/2011     Subjective  No CP, SOB. Still gets occasionally dizzy when he walks around.   Objective   Telemetry: NSR, occasional sinus tach Physical Exam: Filed Vitals:   06/10/11 0520  BP: 98/69  Pulse: 75  Temp: 97 F (36.1 C)  Resp: 14   General: Well developed, well nourished, in no acute distress. Head: Normocephalic, atraumatic, sclera non-icteric, no xanthomas, nares are without discharge.  Neck: Negative for carotid bruits. JVD not elevated. Lungs: Clear bilaterally to auscultation without wheezes, rales, or rhonchi. Breathing is unlabored. Heart: RRR S1 S2 without murmurs, rubs, or gallops.  Abdomen: Soft, non-tender, non-distended with normoactive bowel sounds. No hepatomegaly. No rebound/guarding. No obvious abdominal masses. Msk:  Strength and tone appear normal for age. Extremities: No clubbing or cyanosis. No edema.  Distal pedal pulses are 2+ and equal bilaterally. Skin: dime-sized erythematous nonscaling, nonsuppurative skin lesion on her midback Neuro: Alert and oriented X 3. Moves all extremities spontaneously. Psych:  Responds to questions appropriately with a normal affect.    Intake/Output Summary (Last 24 hours) at 06/10/11 0841 Last data filed at 06/09/11 2252  Gross per 24 hour  Intake   1680 ml  Output   2400 ml  Net   -720 ml    Inpatient Medications:    . aspirin  81 mg Oral Daily  . citalopram  10 mg Oral Daily  . clopidogrel  75 mg Oral Daily  . cyanocobalamin  500 mcg Oral Daily  . gabapentin  600 mg Oral TID  . iron polysaccharides  150 mg Oral Daily  . metoprolol tartrate  25 mg Oral BID  . Milnacipran  100 mg Oral BID  . neomycin-bacitracin-polymyxin   Topical BID  . pantoprazole  40 mg Oral Q0600  . rOPINIRole  3 mg Oral QHS  . simvastatin  20 mg Oral q1800  . Vitamin D (Ergocalciferol)  50,000 Units Oral Q7 days  . warfarin  7.5 mg Oral ONCE-1800  .  Warfarin - Pharmacist Dosing Inpatient   Does not apply q1800    Labs:  [facility] 06/10/11 0520 06/09/11 0535  NA 140 141  K 4.0 4.2  CL 103 106  CO2 28 30  GLUCOSE 98 98  BUN 12 11  CREATININE 0.80 0.82  CALCIUM 9.0 8.9  MG -- --  PHOS -- --    Basename 06/10/11 0520 06/09/11 0535  WBC 5.6 6.4  NEUTROABS -- --  HGB 11.7* 11.7*  HCT 37.7 38.0  MCV 84.3 84.3  PLT 283 277   PT/INR 18.9/1.55  Radiology/Studies:  1. Chest Port 1 View 06/06/2011  *RADIOLOGY REPORT*  Clinical Data: Chest pain (preop cardiac catheterization)  PORTABLE CHEST - 1 VIEW  Comparison: 05/03/2011; 04/11/2011; 03/29/2011; chest CT - 03/25/2011  Findings: Grossly unchanged enlarged cardiac silhouette.  Lung volumes remain persistently reduced with grossly unchanged bibasilar opacities.  No definite evidence of pulmonary edema.  No definite pleural effusion or pneumothorax.  Grossly unchanged bones.  IMPRESSION: 1.  No acute cardiopulmonary disease. 2.  Persistently reduced lung volumes with bibasilar opacities, likely atelectasis.  Original Report Authenticated By: Waynard Reeds, M.D.     Assessment and Plan  51 y/o F with hx of CAD with late-presenting STEMI 03/2011 with prox LAD lesion with distal embolization (prior to PCI) and occlusion of apical LAD s/p Promus DES to prox ruptured plaque at that time, course complicated by hypotension,  Dressler's syndrome with pericarditis/pericardial effusion as well as L pleural effusion s/p thoracentesis all in January. Admitted 4/4 with complaints of chest discomfort with cath showing hazy proximal LAD lesion as previously noted s/p PCI with DES although there was still concern for her CP. Echo 4/6 demonstrated newly recognized mural thrombus, started on heparin -> Coumadin.  1. CAD as above, s/p PCI/DES to prox LAD 06/06/11. PRU acceptable (196) on Plavix. Continnue ASA, statin, BB, Plavix for now (see below).  2. Newly recognized mural thrombus by echo 06/07/11 - goal INR  1.7-2.2. Currently on heparin ->Coumadin overlap. Per Dr. Rosalyn Charters note, likely will end up with Plavix + Coumadin without ASA.  3. Chronic borderline low blood pressures - she complains of occasional dizziness. Consider decreasing BB.  4. Mild anemia - stable. Continue iron. F/u closely as OP given addition of Coumadin.  5. Skin lesion - unclear etiology. Does not appear to be pityriasis, hives, or shingles. Continue neosporin. F/u PCP if it does not resolve for bx.  Signed, Ronie Spies PA-C  Patient seen, examined. Available data reviewed. Agree with findings, assessment, and plan as outlined by Ronie Spies, PA-C. The patient has developed recurrent chest pain this morning. There are typical and atypical features - her chest wall is tender to touch, but she also has pain radiating to her neck. EKG shows no acute changes compared with baseline tracing. I'm not certain, but I don't think this is a coronary-related event. She has had Dressler's and she has had intermittent pain for some time now. Will check a chest CT to rule out pulmonary embolus, aortic pathology, etc. Will also cycle enzymes. Morphine for pain control. Continue to follow closely.  Tonny Bollman, M.D. 06/10/2011 12:59 PM

## 2011-06-10 NOTE — Progress Notes (Signed)
ANTICOAGULATION CONSULT NOTE - Follow Up Consult  Pharmacy Consult for Heparin Indication: LV mural thrombus   No Known Allergies  Vital Signs: Temp: 97 F (36.1 C) (04/09 0520) Temp src: Oral (04/09 0520) BP: 106/70 mmHg (04/09 0955) Pulse Rate: 75  (04/09 0520)  Labs:  Basename 06/10/11 0520 06/09/11 1704 06/09/11 0535 06/08/11 0224  HGB 11.7* -- 11.7* --  HCT 37.7 -- 38.0 37.3  PLT 283 -- 277 256  APTT -- -- -- --  LABPROT 18.9* -- 14.4 13.6  INR 1.55* -- 1.10 1.02  HEPARINUNFRC 0.46 0.51 0.62 --  CREATININE 0.80 -- 0.82 0.78  CKTOTAL -- -- -- --  CKMB -- -- -- --  TROPONINI -- -- -- --   Estimated Creatinine Clearance: 98 ml/min (by C-G formula based on Cr of 0.8).  Medications:  Heparin @ 1300 units/hr  Assessment: 50yof continues on heparin with a heparin level that is now within desired lower goal after rate change yesterday. INR is now trending upward 1.1>>1.55. No bleeding issues noted. Day #3 of overlap.   Goal of Therapy:  Heparin level 0.3-0.5 INR goal 2-3   Plan:  1) Continue heparin at 1300 units/hr 2) Follow up heparin level in AM 3)Repeat 7.5 mg x1 - INR in am  Severiano Gilbert 06/10/2011,11:19 AM

## 2011-06-10 NOTE — Progress Notes (Addendum)
Pt with episode of chest pain similar to episode from this AM, pt with nausea also this time. Morphine given as ordered for pain and [facility] made aware and orders received, for medication for nausea. Will continue to monitor patient. Tekesha Almgren, Randall An RN

## 2011-06-10 NOTE — Progress Notes (Addendum)
Pt with episode of chest pain raiting 8/10 epicgastric area, traveling underneath left breast., hurts when patient takes a deep breath. Pt BP 98-100/60. Pt given Vicodin as ordered for pain around 1045am  and EKG  Obtained. Ronie Spies [facility] made aware and in  To see patient.  Will monitor patient. Jaret Coppedge, [employer]

## 2011-06-11 DIAGNOSIS — I2510 Atherosclerotic heart disease of native coronary artery without angina pectoris: Secondary | ICD-10-CM

## 2011-06-11 DIAGNOSIS — R21 Rash and other nonspecific skin eruption: Secondary | ICD-10-CM

## 2011-06-11 LAB — COMPREHENSIVE METABOLIC PANEL
ALT: 23 U/L (ref 0–35)
AST: 19 U/L (ref 0–37)
Calcium: 9.1 mg/dL (ref 8.4–10.5)
Sodium: 140 mEq/L (ref 135–145)
Total Protein: 6.6 g/dL (ref 6.0–8.3)

## 2011-06-11 LAB — PROTIME-INR
INR: 1.63 — ABNORMAL HIGH (ref 0.00–1.49)
Prothrombin Time: 19.6 seconds — ABNORMAL HIGH (ref 11.6–15.2)

## 2011-06-11 LAB — CBC
Hemoglobin: 11.7 g/dL — ABNORMAL LOW (ref 12.0–15.0)
MCHC: 30.4 g/dL (ref 30.0–36.0)
RDW: 16.3 % — ABNORMAL HIGH (ref 11.5–15.5)
WBC: 6 10*3/uL (ref 4.0–10.5)

## 2011-06-11 LAB — HEPARIN LEVEL (UNFRACTIONATED): Heparin Unfractionated: 0.4 IU/mL (ref 0.30–0.70)

## 2011-06-11 LAB — URINALYSIS, ROUTINE W REFLEX MICROSCOPIC
Hgb urine dipstick: NEGATIVE
Leukocytes, UA: NEGATIVE
Nitrite: NEGATIVE
Protein, ur: NEGATIVE mg/dL
Urobilinogen, UA: 0.2 mg/dL (ref 0.0–1.0)

## 2011-06-11 MED ORDER — WARFARIN SODIUM 10 MG PO TABS
10.0000 mg | ORAL_TABLET | Freq: Once | ORAL | Status: AC
  Administered 2011-06-11: 10 mg via ORAL
  Filled 2011-06-11: qty 1

## 2011-06-11 MED ORDER — SODIUM CHLORIDE 0.9 % IV SOLN
INTRAVENOUS | Status: DC
  Administered 2011-06-10: 19:00:00 via INTRAVENOUS

## 2011-06-11 NOTE — Progress Notes (Signed)
Cardiac Rehab 1007 Pt states she had walked 4 times already today. Told pt to rest about 3 hrs between walks so she will not tire herself out. Stated she tolerated walks well. Maribeth Jiles DunlapRN

## 2011-06-11 NOTE — Progress Notes (Signed)
Subjective:  Patient has no central chest pain.  Still has tenderness, and sharp pain all the way around rib margin, then a flat papule around the same nerve distribution  ??? Shingles.  Also complaining of sweats, and just not feeling well.  No def findings by echo.  No dysuria.    Objective:  Vital Signs in the last 24 hours: Temp:  [97.5 F (36.4 C)-99.4 F (37.4 C)] 97.7 F (36.5 C) (04/10 0546) Pulse Rate:  [81-98] 96  (04/10 0546) Resp:  [18-20] 18  (04/10 0546) BP: (98-124)/(63-84) 110/73 mmHg (04/10 0546) SpO2:  [99 %] 99 % (04/10 0546) Weight:  [224 lb 3.3 oz (101.7 kg)] 224 lb 3.3 oz (101.7 kg) (04/10 0546)  Intake/Output from previous day: 04/09 0701 - 04/10 0700 In: 2532.8 [P.O.:1920; I.V.:612.8] Out: 2201 [Urine:2200; Stool:1]   Physical Exam: General: Well developed, well nourished, in no acute distress. Head:  Normocephalic and atraumatic. Lungs: Clear to auscultation and percussion. Heart: Normal S1 and S2.  No murmur, rubs or gallops.  Pulses: Pulses normal in all 4 extremities. Extremities: No clubbing or cyanosis. No edema. Neurologic: Alert and oriented x 3.    Lab Results:  Basename 06/11/11 0515 06/10/11 0520  WBC 6.0 5.6  HGB 11.7* 11.7*  PLT 280 283    Basename 06/11/11 0515 06/10/11 0520  NA 140 140  K 3.8 4.0  CL 102 103  CO2 31 28  GLUCOSE 105* 98  BUN 13 12  CREATININE 0.83 0.80    Basename 06/10/11 1932  TROPONINI <0.30   Hepatic Function Panel  Basename 06/11/11 0515  PROT 6.6  ALBUMIN 3.3*  AST 19  ALT 23  ALKPHOS 68  BILITOT 0.1*  BILIDIR --  IBILI --   No results found for this basename: CHOL in the last 72 hours No results found for this basename: PROTIME in the last 72 hours  Imaging: Ct Angio Chest W/cm &/or Wo Cm  06/10/2011  *RADIOLOGY REPORT*  Clinical Data: Chest pain.  Short of breath.  Dizziness.  CT ANGIOGRAPHY CHEST  Technique:  Multidetector CT imaging of the chest using the standard protocol during bolus  administration of intravenous contrast. Multiplanar reconstructed images including MIPs were obtained and reviewed to evaluate the vascular anatomy.  Contrast: 80mL OMNIPAQUE IOHEXOL 350 MG/ML SOLN  Comparison: Chest CT 02/22/2012.  Findings: The study is technically adequate for evaluation of pulmonary embolus.  No pulmonary embolism is present.  Significant bilateral lower lobe dependent atelectasis is present.  There is no effusion.  Incidental imaging of the upper abdomen is within normal limits.  LAD coronary artery stent is present. Heart is mildly enlarged.  No aggressive osseous lesions are identified.  No airspace disease/pneumonia.  Small sclerotic lesion is present in the right glenoid compatible with a bone island.  Three-vessel aortic arch. No acute aortic abnormality.  IMPRESSION: 1.  Technically adequate study without pulmonary embolus. 2. Coronary artery stent. 3.  Mild cardiomegaly. 4.  Basilar atelectasis.  Original Report Authenticated By: Andreas Newport, M.D.    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  Not sure of all of the sweats, and chest pain.  Could rash be shingles???  Will get ID to assess  Recent UTI  --  Recheck urine and do some blood cultures.    Hopefully dc heparin tomorrow      Shawnie Pons, MD, [facility], [facility] 06/11/2011, 9:41 AM

## 2011-06-11 NOTE — Consult Note (Signed)
Date of Admission:  06/06/2011  Date of Consult:  06/11/2011  Reason for Consult: ? Zoster, sweats, weight loss  Referring Physician: Dr. Riley Kill   HPI: Kristy Sloan is an 51 y.o. 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 Patient has continued to have ongoing problems with chest pain both before and after stent placment. She relates pain under her sternum that radiates along her rib  Cage to her left back and lower neck. It can occur both with exertion and rest. In the past 3 days she also noted a skin lesion on her back that burns when touched. There was concern for possible early VZV zoster reactivation. Additionally the patient has had several month hx of sweats both during the day and night occasionally drenching. She also claims to have intermittent fevers though upon close questioning most of the more significant fevers have come in context of acute illnesses such as her Dresslers in January. She claims to have lost 18# and chart does reveal 14# weight loss. She had CT abdomen and pelvis done in March and then CT chest this admission that are unremarkable. She has had blood cultures drawn which were negative. She is followed by a Rheumatologist in [place] who dx her fibromyalgia.   Past Medical History  Diagnosis Date  . Anxiety   . Spinal stenosis   . Fibromyalgia   . Restless legs syndrome   . Tobacco abuse     reports quitting 12/2010  . Pericarditis     In setting of after anterior MI 03/2011  . Sleep apnea   . Coronary artery disease     A.  03/05/2011 - Ant MI - LAD stented w/ 2.75 x 16 mm Promus  DES - course complicated by Pericarditis, re-look cath ok. 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 GERD (gastroesophageal reflux disease)   . 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 Pleural effusion, left     Felt  related to Dressler's s/p thoracentesis 03/27/11 (reactive cells only)  . Hypertension     Past Surgical History  Procedure Date  . Hand surgery   . Cesarean section   . Cardiac catheterization   . Coronary angioplasty   . Tubal ligation   . Coronary artery stent lad 06/06/2011  ergies:   No Known Allergies   Medications: I have reviewed patients current medications as documented in Epic Anti-infectives    None      Social History:  reports that she quit smoking about 6 months ago. Her smoking use included Cigarettes. She has never used smokeless tobacco. She reports that she does not drink alcohol or use illicit drugs.  Family History  Problem Relation Age of Onset  . Coronary artery disease Father   . Coronary artery disease Sister     Posey Rea of age, but sister is younger    As in HPI and primary teams notes otherwise 12 point review of systems is negative  Blood pressure 117/69, pulse 84, temperature 98 F (36.7 C), temperature source Oral, resp. rate 18, height 5\' 3"  (1.6 m), weight 224 lb 3.3 oz (101.7 kg), last menstrual period 05/13/2011, SpO2 98.00%. General: Alert and awake, oriented x3, not in any acute distress. dysphoric HEENT: anicteric sclera, pupils reactive to light and accommodation, EOMI, oropharynx clear and without exudate CVS regular rate, normal r,  no  murmur rubs or gallops Chest: clear to auscultation bilaterally, no wheezing, rales or rhonchi Abdomen: soft nontender, nondistended, normal bowel sounds, Extremities: no  clubbing or edema noted bilaterally Skin:  Fairly flat hypersesthetic area on mid back. No vesicles, no other lesions Neuro: nonfocal, strength and sensation intact   Results for orders placed during the hospital encounter of 06/06/11 (from the past 48 hour(s))  PROTIME-INR     Status: Abnormal   Collection Time   06/10/11  5:20 AM      Component Value Range Comment   Prothrombin Time 18.9 (*) 11.6 - 15.2 (seconds)    INR 1.55 (*)  0.00 - 1.49    CBC     Status: Abnormal   Collection Time   06/10/11  5:20 AM      Component Value Range Comment   WBC 5.6  4.0 - 10.5 (K/uL)    RBC 4.47  3.87 - 5.11 (MIL/uL)    Hemoglobin 11.7 (*) 12.0 - 15.0 (g/dL)    HCT 16.1  09.6 - 04.5 (%)    MCV 84.3  78.0 - 100.0 (fL)    MCH 26.2  26.0 - 34.0 (pg)    MCHC 31.0  30.0 - 36.0 (g/dL)    RDW 40.9 (*) 81.1 - 15.5 (%)    Platelets 283  150 - 400 (K/uL)   HEPARIN LEVEL (UNFRACTIONATED)     Status: Normal   Collection Time   06/10/11  5:20 AM      Component Value Range Comment   Heparin Unfractionated 0.46  0.30 - 0.70 (IU/mL)   BASIC METABOLIC PANEL     Status: Abnormal   Collection Time   06/10/11  5:20 AM      Component Value Range Comment   Sodium 140  135 - 145 (mEq/L)    Potassium 4.0  3.5 - 5.1 (mEq/L)    Chloride 103  96 - 112 (mEq/L)    CO2 28  19 - 32 (mEq/L)    Glucose, Bld 98  70 - 99 (mg/dL)    BUN 12  6 - 23 (mg/dL)    Creatinine, Ser 9.14  0.50 - 1.10 (mg/dL)    Calcium 9.0  8.4 - 10.5 (mg/dL)    GFR calc non Af Amer 86 (*) >90 (mL/min)    GFR calc Af Amer >90  >90 (mL/min)   CARDIAC PANEL(CRET KIN+CKTOT+MB+TROPI)     Status: Normal   Collection Time   06/10/11  7:32 PM      Component Value Range Comment   Total CK 47  7 - 177 (U/L)    CK, MB 1.3  0.3 - 4.0 (ng/mL)    Troponin I <0.30  <0.30 (ng/mL)    Relative Index RELATIVE INDEX IS INVALID  0.0 - 2.5    PROTIME-INR     Status: Abnormal   Collection Time   06/11/11  5:15 AM      Component Value Range Comment   Prothrombin Time 19.6 (*) 11.6 - 15.2 (seconds)    INR 1.63 (*) 0.00 - 1.49    CBC     Status: Abnormal   Collection Time   06/11/11  5:15 AM      Component Value Range Comment   WBC 6.0  4.0 - 10.5 (K/uL)    RBC 4.50  3.87 - 5.11 (MIL/uL)    Hemoglobin 11.7 (*) 12.0 - 15.0 (g/dL)    HCT 78.2  95.6 - 21.3 (%)    MCV 85.6  78.0 - 100.0 (fL)    MCH 26.0  26.0 - 34.0 (pg)    MCHC 30.4  30.0 - 36.0 (g/dL)    RDW 78.2 (*) 95.6 - 15.5 (%)    Platelets  280  150 - 400 (K/uL)   HEPARIN LEVEL (UNFRACTIONATED)     Status: Normal   Collection Time   06/11/11  5:15 AM      Component Value Range Comment   Heparin Unfractionated 0.40  0.30 - 0.70 (IU/mL)   COMPREHENSIVE METABOLIC PANEL     Status: Abnormal   Collection Time   06/11/11  5:15 AM      Component Value Range Comment   Sodium 140  135 - 145 (mEq/L)    Potassium 3.8  3.5 - 5.1 (mEq/L)    Chloride 102  96 - 112 (mEq/L)    CO2 31  19 - 32 (mEq/L)    Glucose, Bld 105 (*) 70 - 99 (mg/dL)    BUN 13  6 - 23 (mg/dL)    Creatinine, Ser 2.13  0.50 - 1.10 (mg/dL)    Calcium 9.1  8.4 - 10.5 (mg/dL)    Total Protein 6.6  6.0 - 8.3 (g/dL)    Albumin 3.3 (*) 3.5 - 5.2 (g/dL)    AST 19  0 - 37 (U/L)    ALT 23  0 - 35 (U/L)    Alkaline Phosphatase 68  39 - 117 (U/L)    Total Bilirubin 0.1 (*) 0.3 - 1.2 (mg/dL)    GFR calc non Af Amer 82 (*) >90 (mL/min)    GFR calc Af Amer >90  >90 (mL/min)   URINALYSIS, ROUTINE W REFLEX MICROSCOPIC     Status: Abnormal   Collection Time   06/11/11 10:34 AM      Component Value Range Comment   Color, Urine YELLOW  YELLOW     APPearance HAZY (*) CLEAR     Specific Gravity, Urine 1.026  1.005 - 1.030     pH 6.0  5.0 - 8.0     Glucose, UA NEGATIVE  NEGATIVE (mg/dL)    Hgb urine dipstick NEGATIVE  NEGATIVE     Bilirubin Urine SMALL (*) NEGATIVE     Ketones, ur NEGATIVE  NEGATIVE (mg/dL)    Protein, ur NEGATIVE  NEGATIVE (mg/dL)    Urobilinogen, UA 0.2  0.0 - 1.0 (mg/dL)    Nitrite NEGATIVE  NEGATIVE     Leukocytes, UA NEGATIVE  NEGATIVE  MICROSCOPIC NOT DONE ON URINES WITH NEGATIVE PROTEIN, BLOOD, LEUKOCYTES, NITRITE, OR GLUCOSE <1000 mg/dL.      Component Value Date/Time   SDES PLEURAL FLUID LEFT 03/27/2011 1514   SPECREQUEST NONE 03/27/2011 1514   CULT NO GROWTH 3 DAYS 03/27/2011 1514   REPTSTATUS 03/31/2011 FINAL 03/27/2011 1514   Ct Angio Chest W/cm &/or Wo Cm  06/10/2011  *RADIOLOGY REPORT*  Clinical Data: Chest pain.  Short of breath.  Dizziness.  CT  ANGIOGRAPHY CHEST  Technique:  Multidetector CT imaging of the chest using the standard protocol during bolus administration of intravenous contrast. Multiplanar reconstructed images including MIPs were obtained and reviewed to evaluate the vascular anatomy.  Contrast: 80mL OMNIPAQUE IOHEXOL 350 MG/ML SOLN  Comparison: Chest CT 02/22/2012.  Findings: The study is technically adequate for evaluation of pulmonary embolus.  No pulmonary embolism is present.  Significant bilateral lower lobe dependent atelectasis is present.  There is no effusion.  Incidental imaging of the upper abdomen is within normal limits.  LAD coronary  artery stent is present. Heart is mildly enlarged.  No aggressive osseous lesions are identified.  No airspace disease/pneumonia.  Small sclerotic lesion is present in the right glenoid compatible with a bone island.  Three-vessel aortic arch. No acute aortic abnormality.  IMPRESSION: 1.  Technically adequate study without pulmonary embolus. 2. Coronary artery stent. 3.  Mild cardiomegaly. 4.  Basilar atelectasis.  Original Report Authenticated By: Andreas Newport, M.D.     No results found for this or any previous visit (from the past 720 hour(s)).   Impression/Recommendation  1) Rash with chest pain and concern for VZV:  I dont think this is early VZV. I think her rash on back is more likely related to contact dermatitis or pressure. I think her chest pain is more likely related to something else other than VZV. She is being followed very closely by Cardiology and she does also carry a diagnosis of FMyalgia.   2) Sweats, weight loss with occasional fevers: Blood cultures are reasonable. She has already been worked up by [employer] so I dont think I should repeat much in way of Rheum labs. I will check her for HIV, Viral hepatitis, EBV and CMV serologies (though these nearly always simply reveal past infection), ESR, CRP, ferritin. Cryoglobulins, SPEP, LDH and quantiferon gold. I  would not push for much beyond this at this point. If workup is negative and she does have continued measured fevers we would be happy to consider other workup as an outpatient.   Thank you so much for this interesting consult,   Acey Lav 06/11/2011, 5:25 PM   [phone] (pager) [phone] (office)

## 2011-06-11 NOTE — Progress Notes (Signed)
ANTICOAGULATION CONSULT NOTE - Follow Up Consult  Pharmacy Consult for Heparin/Coumadin Indication: LV mural thrombus   No Known Allergies  Vital Signs: Temp: 97.7 F (36.5 C) (04/10 0546) Temp src: Oral (04/10 0546) BP: 110/73 mmHg (04/10 0546) Pulse Rate: 96  (04/10 0546)  Labs:  Basename 06/11/11 0515 06/10/11 1932 06/10/11 0520 06/09/11 1704 06/09/11 0535  HGB 11.7* -- 11.7* -- --  HCT 38.5 -- 37.7 -- 38.0  PLT 280 -- 283 -- 277  APTT -- -- -- -- --  LABPROT 19.6* -- 18.9* -- 14.4  INR 1.63* -- 1.55* -- 1.10  HEPARINUNFRC 0.40 -- 0.46 0.51 --  CREATININE 0.83 -- 0.80 -- 0.82  CKTOTAL -- 47 -- -- --  CKMB -- 1.3 -- -- --  TROPONINI -- <0.30 -- -- --   Estimated Creatinine Clearance: 94.3 ml/min (by C-G formula based on Cr of 0.83).  Medications:  Heparin @ 1300 units/hr  Assessment: 50yof continues on heparin at 1300 units/hr with a heparin level that is within desired lower goal range. INR is now trending upward 1.63. No bleeding issues noted. Day #5/5 of overlap, however, INR must be therapeutic x 24 hrs before heparin can be d/c'd.  Noted lower INR goal per Dr. Rosalyn Charters note.  Goal of Therapy:  Heparin level 0.3-0.5 INR goal 1.7-2.2   Plan:  1) Continue heparin at 1300 units/hr 2) Follow up heparin level in AM 3) Coumadin 10 mg po x 1 tonight.  Wiatt Mahabir C 06/11/2011,11:21 AM

## 2011-06-12 LAB — CBC
MCHC: 30.7 g/dL (ref 30.0–36.0)
RDW: 16.5 % — ABNORMAL HIGH (ref 11.5–15.5)
WBC: 5.9 10*3/uL (ref 4.0–10.5)

## 2011-06-12 LAB — PROTIME-INR
INR: 2.11 — ABNORMAL HIGH (ref 0.00–1.49)
Prothrombin Time: 24 seconds — ABNORMAL HIGH (ref 11.6–15.2)

## 2011-06-12 LAB — HEPATITIS PANEL, ACUTE
HCV Ab: NEGATIVE
Hep A IgM: NEGATIVE
Hep B C IgM: NEGATIVE
Hepatitis B Surface Ag: NEGATIVE

## 2011-06-12 LAB — HEPARIN LEVEL (UNFRACTIONATED): Heparin Unfractionated: 0.51 IU/mL (ref 0.30–0.70)

## 2011-06-12 LAB — HIV ANTIBODY (ROUTINE TESTING W REFLEX): HIV: NONREACTIVE

## 2011-06-12 MED ORDER — SENNOSIDES-DOCUSATE SODIUM 8.6-50 MG PO TABS
1.0000 | ORAL_TABLET | Freq: Two times a day (BID) | ORAL | Status: AC
  Administered 2011-06-12 – 2011-06-13 (×2): 1 via ORAL
  Filled 2011-06-12 (×2): qty 1

## 2011-06-12 MED ORDER — WARFARIN SODIUM 6 MG PO TABS
6.0000 mg | ORAL_TABLET | Freq: Once | ORAL | Status: AC
  Administered 2011-06-12: 6 mg via ORAL
  Filled 2011-06-12: qty 1

## 2011-06-12 NOTE — Progress Notes (Signed)
Subjective:  Seems to be doing some better.  Feels good.  No progressive shortness of breath.  Ambulating.  CP seems to have settled down.  Appreciate ID help    Objective:  Vital Signs in the last 24 hours: Temp:  [97.3 F (36.3 C)-98.3 F (36.8 C)] 97.8 F (36.6 C) (04/11 1315) Pulse Rate:  [86-96] 91  (04/11 1315) Resp:  [18-20] 20  (04/11 1315) BP: (89-127)/(56-67) 127/67 mmHg (04/11 1315) SpO2:  [93 %-96 %] 95 % (04/11 1315)  Intake/Output from previous day: 04/10 0701 - 04/11 0700 In: 1502 [P.O.:840; I.V.:662] Out: 750 [Urine:750]   Physical Exam: General: Well developed, well nourished, in no acute distress. Head:  Normocephalic and atraumatic. Lungs: Clear to auscultation and percussion. Heart: Normal S1 and S2.  No murmur, rubs or gallops. No change in small lesion posteriorly Extremities: No clubbing or cyanosis. No edema. Neurologic: Alert and oriented x 3.    Lab Results:  [facility] 06/12/11 0640 06/11/11 0515  WBC 5.9 6.0  HGB 12.2 11.7*  PLT 294 280    Basename 06/11/11 0515 06/10/11 0520  NA 140 140  K 3.8 4.0  CL 102 103  CO2 31 28  GLUCOSE 105* 98  BUN 13 12  CREATININE 0.83 0.80    Basename 06/10/11 1932  TROPONINI <0.30   Hepatic Function Panel  Basename 06/11/11 0515  PROT 6.6  ALBUMIN 3.3*  AST 19  ALT 23  ALKPHOS 68  BILITOT 0.1*  BILIDIR --  IBILI --   No results found for this basename: CHOL in the last 72 hours No results found for this basename: PROTIME in the last 72 hours  Imaging: No results found.  Blood cultures negative times one.    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  Need to continue to monitor closely.    Plan dc tomorrow.  ID studies pending.        Shawnie Pons, MD, [facility], FSCAI 06/12/2011, 5:45 PM

## 2011-06-12 NOTE — Progress Notes (Signed)
   CARE MANAGEMENT NOTE 06/12/2011  Patient:  Kristy Sloan, Kristy Sloan   Account Number:  [id number]  Date Initiated:  06/12/2011  Documentation initiated by:  GRAVES-BIGELOW,Rhyatt Muska  Subjective/Objective Assessment:   Pt presented with surgery-LHC for chest pain. Pt has skin rash unclear etiology.     Action/Plan:   Anticipated DC Date:  06/16/2011   Anticipated DC Plan:           Choice offered to / List presented to:             Status of service:  In process, will continue to follow Medicare Important Message given?   (If response is "NO", the following Medicare IM given date fields will be blank) Date Medicare IM given:   Date Additional Medicare IM given:    Discharge Disposition:    Per UR Regulation:    If discussed at Long Length of Stay Meetings, dates discussed:    Comments:  06-12-11 1548 Tomi Bamberger, RN,BSN [phone] CM will continue to monitor for disposition needs if any.

## 2011-06-12 NOTE — Progress Notes (Signed)
ANTICOAGULATION CONSULT NOTE - Follow Up Consult  Pharmacy Consult for Heparin/Coumadin Indication: LV mural thrombus   No Known Allergies  Vital Signs: Temp: 97.3 F (36.3 C) (04/11 0931) Temp src: Oral (04/11 0931) BP: 109/56 mmHg (04/11 0931) Pulse Rate: 96  (04/11 0931)  Labs:  Basename 06/12/11 0640 06/11/11 0515 06/10/11 1932 06/10/11 0520  HGB 12.2 11.7* -- --  HCT 39.7 38.5 -- 37.7  PLT 294 280 -- 283  APTT -- -- -- --  LABPROT 24.0* 19.6* -- 18.9*  INR 2.11* 1.63* -- 1.55*  HEPARINUNFRC 0.51 0.40 -- 0.46  CREATININE -- 0.83 -- 0.80  CKTOTAL -- -- 47 --  CKMB -- -- 1.3 --  TROPONINI -- -- <0.30 --   Estimated Creatinine Clearance: 94.3 ml/min (by C-G formula based on Cr of 0.83).    Assessment: 50 YOF continues on heparin gtt and Coumadin for LV mural thrombus, overlap D# 6/5.  INR therapeutic but will continue heparin x 24 more hours prior to recommending discontinuation.  Heparin level slightly above goal today but has been stable on 1300 units/hr.  No bleeding documented.   Goal of Therapy:  HL = 0.3 - 0.5 units/mL INR = 1.7 - 2.2 d/t DAPT    Plan:  - Continue heparin at 1300 units/hr - Coumadin 6mg  PO today - Daily HL / CBC / PT / INR - F/U with order to d/c heparin    Kristy Sloan D. Laney Potash, PharmD, BCPS Pager:  [phone] 06/12/2011, 10:49 AM

## 2011-06-12 NOTE — Progress Notes (Signed)
Addended by: Reine Just on: 06/12/2011 02:09 PM   Modules accepted: Orders

## 2011-06-12 NOTE — Progress Notes (Signed)
Cardiac Rehab [phone] Pt walking independently with steady gait. We will sign off since ed done and pt can walk with steady gait. Baran Kuhrt DunlapRN

## 2011-06-13 ENCOUNTER — Encounter (HOSPITAL_COMMUNITY): Payer: Self-pay | Admitting: Nurse Practitioner

## 2011-06-13 DIAGNOSIS — I2510 Atherosclerotic heart disease of native coronary artery without angina pectoris: Secondary | ICD-10-CM

## 2011-06-13 DIAGNOSIS — I200 Unstable angina: Secondary | ICD-10-CM

## 2011-06-13 LAB — CBC
HCT: 38.1 % (ref 36.0–46.0)
MCHC: 31 g/dL (ref 30.0–36.0)
MCV: 83.9 fL (ref 78.0–100.0)
RDW: 16.6 % — ABNORMAL HIGH (ref 11.5–15.5)
WBC: 6.6 10*3/uL (ref 4.0–10.5)

## 2011-06-13 LAB — PROTIME-INR: INR: 3.22 — ABNORMAL HIGH (ref 0.00–1.49)

## 2011-06-13 MED ORDER — BACITRACIN-NEOMYCIN-POLYMYXIN OINTMENT TUBE: 1.0000 "application " | TOPICAL_OINTMENT | CUTANEOUS | Status: DC | PRN

## 2011-06-13 MED ORDER — PHYTONADIONE 1 MG/0.5 ML ORAL SOLUTION
1.0000 mg | Freq: Once | ORAL | Status: AC
  Administered 2011-06-13: 1 mg via ORAL
  Filled 2011-06-13: qty 0.5

## 2011-06-13 MED ORDER — WARFARIN SODIUM 5 MG PO TABS: 5.0000 mg | ORAL_TABLET | Freq: Every day | ORAL | Status: DC

## 2011-06-13 NOTE — Progress Notes (Signed)
ANTICOAGULATION CONSULT NOTE - Follow Up Consult  Pharmacy Consult for Coumadin (off heparin) Indication: LV mural thrombus   No Known Allergies  Vital Signs: Temp: 97.6 F (36.4 C) (04/12 0507) Temp src: Oral (04/12 0507) BP: 113/69 mmHg (04/12 0507) Pulse Rate: 93  (04/12 0507)  Labs:  Basename 06/13/11 0500 06/12/11 0640 06/11/11 0515 06/10/11 1932  HGB 11.8* 12.2 -- --  HCT 38.1 39.7 38.5 --  PLT 268 294 280 --  APTT -- -- -- --  LABPROT 33.2* 24.0* 19.6* --  INR 3.19* 2.11* 1.63* --  HEPARINUNFRC <0.10* 0.51 0.40 --  CREATININE -- -- 0.83 --  CKTOTAL -- -- -- 47  CKMB -- -- -- 1.3  TROPONINI -- -- -- <0.30   Estimated Creatinine Clearance: 94.3 ml/min (by C-G formula based on Cr of 0.83).    Assessment: 51 year old female with mural thrombus now off heparin drip with supratherapeutic INR, well above desired goal. No bleeding complications have been noted. Patient appears to be a delayed responder to coumadin and in our attempts to reach a target INR we appear to have overshot. No drug interactions appear on profile that could potentiate warfarin effects. Given low desired INR goal on DAPT will hold dose today 06/13/2011    Goal of Therapy:  INR = 1.7 - 2.2 d/t DAPT    Plan:  Hold warfarin today Follow INR in am  Consider scheduling aspirin prior to naproxen in the morning if patient will be going home on naproxen due to the potential for naproxen bind to TXA2 sites prior to aspirin administration then falling off leaving no platelet inhibition with the aspirin.  Once stable, I would estimate that patiet may need ~5mg  daily to achieve target INR.  Sheppard Coil PharmD, BCPS [phone]

## 2011-06-13 NOTE — Progress Notes (Addendum)
Subjective:  She is much better, and pushing to go home.  Ambulating halls without difficulty.  Pharmacy notes reviewed in detail.  She has no clinical bleeding.    Objective:  Vital Signs in the last 24 hours: Temp:  [97.6 F (36.4 C)-98.1 F (36.7 C)] 97.6 F (36.4 C) (04/12 0507) Pulse Rate:  [90-93] 93  (04/12 0507) Resp:  [18-20] 18  (04/12 0507) BP: (113-127)/(67-76) 113/69 mmHg (04/12 0507) SpO2:  [95 %-99 %] 97 % (04/12 0507)  Intake/Output from previous day: 04/11 0701 - 04/12 0700 In: 2340 [P.O.:2340] Out: 900 [Urine:900]   Physical Exam: General: Well developed, well nourished, in no acute distress. Head:  Normocephalic and atraumatic. Lungs: Clear to auscultation and percussion. Heart: Normal S1 and S2.  No murmur, rubs or gallops.  Pulses: Pulses normal in all 4 extremities. Extremities: No clubbing or cyanosis. No edema. Neurologic: Alert and oriented x 3.    Lab Results:  Basename 06/13/11 0500 06/12/11 0640  WBC 6.6 5.9  HGB 11.8* 12.2  PLT 268 294    Basename 06/11/11 0515  NA 140  K 3.8  CL 102  CO2 31  GLUCOSE 105*  BUN 13  CREATININE 0.83    Basename 06/10/11 1932  TROPONINI <0.30   Hepatic Function Panel  Basename 06/11/11 0515  PROT 6.6  ALBUMIN 3.3*  AST 19  ALT 23  ALKPHOS 68  BILITOT 0.1*  BILIDIR --  IBILI --   No results found for this basename: CHOL in the last 72 hours No results found for this basename: PROTIME in the last 72 hours  Imaging: No results found.    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     Plan: Risk of bleeding is real, so will reverse with one dose of low dose VITK.  Recheck and possibly dc later today if INR is not out  further.  If it is, will hold discharge.  We will decide when we get labs. We will stop Naproxen even though it has helped.   Will continue ASA since I will hold warfarin until I see her back on Monday.     She needs to follow up with me on Monday even though i am in cath lab, we can get labs.        Shawnie Pons, MD, [facility], FSCAI 06/13/2011, 10:39 AM

## 2011-06-13 NOTE — Discharge Instructions (Signed)
***  PLEASE REMEMBER TO BRING ALL OF YOUR MEDICATIONS TO EACH OF YOUR FOLLOW-UP OFFICE VISITS.  NO HEAVY LIFTING OR SEXUAL ACTIVITY X 7 DAYS. NO DRIVING X 2-3 DAYS. NO SOAKING BATHS, HOT TUBS, POOLS, ETC., X 7 DAYS.  

## 2011-06-16 ENCOUNTER — Encounter (HOSPITAL_COMMUNITY): Payer: Self-pay

## 2011-06-16 ENCOUNTER — Telehealth: Payer: Self-pay | Admitting: Pharmacist

## 2011-06-16 ENCOUNTER — Inpatient Hospital Stay (HOSPITAL_COMMUNITY)
Admission: EM | Admit: 2011-06-16 | Discharge: 2011-06-19 | DRG: 313 | Disposition: A | Discharge status: Home / Self Care | Payer: Self-pay | Attending: Cardiology | Admitting: Cardiology

## 2011-06-16 ENCOUNTER — Emergency Department (HOSPITAL_COMMUNITY): Payer: Self-pay

## 2011-06-16 DIAGNOSIS — K219 Gastro-esophageal reflux disease without esophagitis: Secondary | ICD-10-CM | POA: Diagnosis present

## 2011-06-16 DIAGNOSIS — R519 Headache, unspecified: Secondary | ICD-10-CM

## 2011-06-16 DIAGNOSIS — I5189 Other ill-defined heart diseases: Secondary | ICD-10-CM | POA: Diagnosis present

## 2011-06-16 DIAGNOSIS — I10 Essential (primary) hypertension: Secondary | ICD-10-CM | POA: Diagnosis present

## 2011-06-16 DIAGNOSIS — F411 Generalized anxiety disorder: Secondary | ICD-10-CM | POA: Diagnosis present

## 2011-06-16 DIAGNOSIS — E782 Mixed hyperlipidemia: Secondary | ICD-10-CM | POA: Diagnosis present

## 2011-06-16 DIAGNOSIS — I241 Dressler's syndrome: Secondary | ICD-10-CM | POA: Insufficient documentation

## 2011-06-16 DIAGNOSIS — G4730 Sleep apnea, unspecified: Secondary | ICD-10-CM | POA: Diagnosis present

## 2011-06-16 DIAGNOSIS — IMO0001 Reserved for inherently not codable concepts without codable children: Secondary | ICD-10-CM | POA: Diagnosis present

## 2011-06-16 DIAGNOSIS — G2581 Restless legs syndrome: Secondary | ICD-10-CM | POA: Diagnosis present

## 2011-06-16 DIAGNOSIS — M4800 Spinal stenosis, site unspecified: Secondary | ICD-10-CM | POA: Diagnosis present

## 2011-06-16 DIAGNOSIS — R079 Chest pain, unspecified: Secondary | ICD-10-CM | POA: Diagnosis present

## 2011-06-16 DIAGNOSIS — I309 Acute pericarditis, unspecified: Secondary | ICD-10-CM | POA: Diagnosis present

## 2011-06-16 DIAGNOSIS — G8929 Other chronic pain: Secondary | ICD-10-CM

## 2011-06-16 DIAGNOSIS — R0602 Shortness of breath: Secondary | ICD-10-CM

## 2011-06-16 DIAGNOSIS — I513 Intracardiac thrombosis, not elsewhere classified: Secondary | ICD-10-CM

## 2011-06-16 DIAGNOSIS — I2510 Atherosclerotic heart disease of native coronary artery without angina pectoris: Secondary | ICD-10-CM | POA: Diagnosis present

## 2011-06-16 DIAGNOSIS — G894 Chronic pain syndrome: Secondary | ICD-10-CM | POA: Diagnosis present

## 2011-06-16 DIAGNOSIS — M797 Fibromyalgia: Secondary | ICD-10-CM | POA: Diagnosis present

## 2011-06-16 DIAGNOSIS — R0789 Other chest pain: Principal | ICD-10-CM | POA: Diagnosis present

## 2011-06-16 HISTORY — DX: Heart failure, unspecified: I50.9

## 2011-06-16 HISTORY — DX: Peripheral vascular disease, unspecified: I73.9

## 2011-06-16 HISTORY — DX: Umbilical hernia without obstruction or gangrene: K42.9

## 2011-06-16 HISTORY — DX: Other chronic pain: G89.29

## 2011-06-16 HISTORY — DX: Headache, unspecified: R51.9

## 2011-06-16 HISTORY — DX: Cardiac murmur, unspecified: R01.1

## 2011-06-16 HISTORY — DX: Pure hypercholesterolemia, unspecified: E78.00

## 2011-06-16 HISTORY — DX: Major depressive disorder, single episode, unspecified: F32.9

## 2011-06-16 HISTORY — DX: Depression, unspecified: F32.A

## 2011-06-16 HISTORY — DX: Headache: R51

## 2011-06-16 HISTORY — DX: Shortness of breath: R06.02

## 2011-06-16 LAB — CMV IGM: CMV IgM: 0.22 (ref <0.90)

## 2011-06-16 LAB — BASIC METABOLIC PANEL
BUN: 12 mg/dL (ref 6–23)
Chloride: 109 mEq/L (ref 96–112)
GFR calc Af Amer: >90 mL/min (ref >90)
GFR calc non Af Amer: >90 mL/min (ref >90)
Potassium: 4 mEq/L (ref 3.5–5.1)
Sodium: 142 mEq/L (ref 135–145)

## 2011-06-16 LAB — CARDIAC PANEL(CRET KIN+CKTOT+MB+TROPI)
CK, MB: 1.4 ng/mL (ref 0.3–4.0)
CK, MB: 1.3 ng/mL (ref 0.3–4.0)
Total CK: 50 U/L (ref 7–177)
Troponin I: <0.3 ng/mL (ref <0.30)
Troponin I: <0.3 ng/mL (ref <0.30)

## 2011-06-16 LAB — CBC
MCHC: 31.1 g/dL (ref 30.0–36.0)
Platelets: 295 10*3/uL (ref 150–400)
RDW: 16.9 % — ABNORMAL HIGH (ref 11.5–15.5)
WBC: 8.4 10*3/uL (ref 4.0–10.5)

## 2011-06-16 LAB — PROTIME-INR: INR: 1.27 (ref 0.00–1.49)

## 2011-06-16 LAB — PRO B NATRIURETIC PEPTIDE: Pro B Natriuretic peptide (BNP): 267 pg/mL — ABNORMAL HIGH (ref 0–125)

## 2011-06-16 LAB — TROPONIN I: Troponin I: <0.3 ng/mL (ref <0.30)

## 2011-06-16 MED ORDER — POLYSACCHARIDE IRON COMPLEX 150 MG PO CAPS
150.0000 mg | ORAL_CAPSULE | ORAL | Status: DC
  Administered 2011-06-18: 150 mg via ORAL
  Filled 2011-06-16: qty 1

## 2011-06-16 MED ORDER — PANTOPRAZOLE SODIUM 40 MG PO TBEC
40.0000 mg | DELAYED_RELEASE_TABLET | Freq: Every day | ORAL | Status: DC
  Administered 2011-06-17 – 2011-06-19 (×3): 40 mg via ORAL
  Filled 2011-06-16 (×3): qty 1

## 2011-06-16 MED ORDER — SODIUM CHLORIDE 0.9 % IV SOLN: 250.0000 mL | INTRAVENOUS | Status: DC | PRN

## 2011-06-16 MED ORDER — CYANOCOBALAMIN 1000 MCG/ML IJ SOLN: 1000.0000 ug | INTRAMUSCULAR | Status: DC

## 2011-06-16 MED ORDER — DOCUSATE SODIUM 100 MG PO CAPS
100.0000 mg | ORAL_CAPSULE | ORAL | Status: AC
  Administered 2011-06-16: 100 mg via ORAL
  Filled 2011-06-16: qty 1

## 2011-06-16 MED ORDER — SODIUM CHLORIDE 0.9 % IJ SOLN: 3.0000 mL | INTRAMUSCULAR | Status: DC | PRN

## 2011-06-16 MED ORDER — CLOPIDOGREL BISULFATE 75 MG PO TABS
75.0000 mg | ORAL_TABLET | Freq: Every day | ORAL | Status: DC
  Administered 2011-06-16 – 2011-06-19 (×4): 75 mg via ORAL
  Filled 2011-06-16 (×4): qty 1

## 2011-06-16 MED ORDER — DOCUSATE SODIUM 100 MG PO CAPS
100.0000 mg | ORAL_CAPSULE | Freq: Every day | ORAL | Status: DC
  Administered 2011-06-17 – 2011-06-19 (×3): 100 mg via ORAL
  Filled 2011-06-16 (×3): qty 1

## 2011-06-16 MED ORDER — CYANOCOBALAMIN 500 MCG PO TABS
500.0000 ug | ORAL_TABLET | Freq: Every day | ORAL | Status: DC
  Administered 2011-06-17 – 2011-06-19 (×3): 500 ug via ORAL
  Filled 2011-06-16 (×3): qty 1

## 2011-06-16 MED ORDER — CITALOPRAM HYDROBROMIDE 10 MG PO TABS
10.0000 mg | ORAL_TABLET | ORAL | Status: AC
  Administered 2011-06-16: 10 mg via ORAL
  Filled 2011-06-16: qty 1

## 2011-06-16 MED ORDER — ASPIRIN EC 81 MG PO TBEC: 81.0000 mg | DELAYED_RELEASE_TABLET | Freq: Every day | ORAL | Status: DC

## 2011-06-16 MED ORDER — METOPROLOL TARTRATE 25 MG PO TABS
25.0000 mg | ORAL_TABLET | Freq: Two times a day (BID) | ORAL | Status: DC
  Administered 2011-06-16 – 2011-06-19 (×7): 25 mg via ORAL
  Filled 2011-06-16 (×8): qty 1

## 2011-06-16 MED ORDER — SODIUM CHLORIDE 0.9 % IJ SOLN
3.0000 mL | Freq: Two times a day (BID) | INTRAMUSCULAR | Status: DC
  Administered 2011-06-18: 3 mL via INTRAVENOUS

## 2011-06-16 MED ORDER — ASPIRIN 81 MG PO CHEW
81.0000 mg | CHEWABLE_TABLET | ORAL | Status: AC
  Filled 2011-06-16: qty 1

## 2011-06-16 MED ORDER — NITROGLYCERIN 0.4 MG SL SUBL
0.4000 mg | SUBLINGUAL_TABLET | SUBLINGUAL | Status: DC | PRN
  Administered 2011-06-16: 0.4 mg via SUBLINGUAL

## 2011-06-16 MED ORDER — GABAPENTIN 300 MG PO CAPS
600.0000 mg | ORAL_CAPSULE | ORAL | Status: AC
  Administered 2011-06-16: 600 mg via ORAL
  Filled 2011-06-16: qty 2

## 2011-06-16 MED ORDER — MILNACIPRAN HCL 50 MG PO TABS
100.0000 mg | ORAL_TABLET | Freq: Two times a day (BID) | ORAL | Status: DC
  Filled 2011-06-16: qty 2

## 2011-06-16 MED ORDER — MILNACIPRAN HCL 50 MG PO TABS
100.0000 mg | ORAL_TABLET | Freq: Two times a day (BID) | ORAL | Status: DC
  Administered 2011-06-16 – 2011-06-19 (×6): 100 mg via ORAL
  Filled 2011-06-16 (×7): qty 2

## 2011-06-16 MED ORDER — ALPRAZOLAM 0.25 MG PO TABS
0.2500 mg | ORAL_TABLET | Freq: Two times a day (BID) | ORAL | Status: DC | PRN
  Administered 2011-06-16 – 2011-06-19 (×5): 0.25 mg via ORAL
  Filled 2011-06-16 (×5): qty 1

## 2011-06-16 MED ORDER — BACITRACIN-NEOMYCIN-POLYMYXIN OINTMENT TUBE
1.0000 "application " | TOPICAL_OINTMENT | CUTANEOUS | Status: DC | PRN
  Filled 2011-06-16: qty 15

## 2011-06-16 MED ORDER — GABAPENTIN 300 MG PO CAPS
600.0000 mg | ORAL_CAPSULE | Freq: Three times a day (TID) | ORAL | Status: DC
  Administered 2011-06-16 – 2011-06-19 (×8): 600 mg via ORAL
  Filled 2011-06-16 (×10): qty 2

## 2011-06-16 MED ORDER — HYDROCODONE-ACETAMINOPHEN 10-325 MG PO TABS
1.0000 | ORAL_TABLET | ORAL | Status: DC | PRN
  Administered 2011-06-16 – 2011-06-19 (×17): 1 via ORAL
  Filled 2011-06-16 (×17): qty 1

## 2011-06-16 MED ORDER — METOPROLOL TARTRATE 1 MG/ML IV SOLN: 2.5000 mg | Freq: Four times a day (QID) | INTRAVENOUS | Status: DC | PRN

## 2011-06-16 MED ORDER — TIZANIDINE HCL 4 MG PO TABS
6.0000 mg | ORAL_TABLET | ORAL | Status: AC
  Administered 2011-06-16: 6 mg via ORAL
  Filled 2011-06-16: qty 1

## 2011-06-16 MED ORDER — TIZANIDINE HCL 4 MG PO TABS
6.0000 mg | ORAL_TABLET | Freq: Three times a day (TID) | ORAL | Status: DC
  Administered 2011-06-16 – 2011-06-19 (×8): 6 mg via ORAL
  Filled 2011-06-16 (×10): qty 1

## 2011-06-16 MED ORDER — FERROUS FUMARATE 325 (106 FE) MG PO TABS
1.0000 | ORAL_TABLET | ORAL | Status: DC
  Filled 2011-06-16: qty 1

## 2011-06-16 MED ORDER — ONDANSETRON HCL 4 MG/2ML IJ SOLN: 4.0000 mg | Freq: Four times a day (QID) | INTRAMUSCULAR | Status: DC | PRN

## 2011-06-16 MED ORDER — ACETAMINOPHEN 325 MG PO TABS: 650.0000 mg | ORAL_TABLET | ORAL | Status: DC | PRN

## 2011-06-16 MED ORDER — METOPROLOL TARTRATE 25 MG PO TABS: 25.0000 mg | ORAL_TABLET | ORAL | Status: AC

## 2011-06-16 MED ORDER — HEPARIN BOLUS VIA INFUSION
4000.0000 [IU] | Freq: Once | INTRAVENOUS | Status: AC
  Administered 2011-06-16: 4000 [IU] via INTRAVENOUS

## 2011-06-16 MED ORDER — FERROUS FUM-IRON POLYSACCH 162-115.2 MG PO CAPS: 1.0000 | ORAL_CAPSULE | ORAL | Status: DC

## 2011-06-16 MED ORDER — VITAMIN D (ERGOCALCIFEROL) 1.25 MG (50000 UNIT) PO CAPS
50000.0000 [IU] | ORAL_CAPSULE | ORAL | Status: AC
  Administered 2011-06-16: 50000 [IU] via ORAL
  Filled 2011-06-16: qty 1

## 2011-06-16 MED ORDER — CITALOPRAM HYDROBROMIDE 10 MG PO TABS
10.0000 mg | ORAL_TABLET | Freq: Every day | ORAL | Status: DC
  Administered 2011-06-17 – 2011-06-19 (×3): 10 mg via ORAL
  Filled 2011-06-16 (×3): qty 1

## 2011-06-16 MED ORDER — WARFARIN SODIUM 5 MG PO TABS
5.0000 mg | ORAL_TABLET | ORAL | Status: AC
  Administered 2011-06-16: 5 mg via ORAL
  Filled 2011-06-16: qty 1

## 2011-06-16 MED ORDER — HEPARIN (PORCINE) IN NACL 100-0.45 UNIT/ML-% IJ SOLN
1300.0000 [IU]/h | INTRAMUSCULAR | Status: DC
  Administered 2011-06-16 – 2011-06-19 (×5): 1300 [IU]/h via INTRAVENOUS
  Filled 2011-06-16 (×5): qty 250

## 2011-06-16 MED ORDER — ZOLPIDEM TARTRATE 5 MG PO TABS
10.0000 mg | ORAL_TABLET | Freq: Every evening | ORAL | Status: DC | PRN
  Administered 2011-06-16 – 2011-06-17 (×2): 10 mg via ORAL
  Filled 2011-06-16 (×2): qty 2

## 2011-06-16 MED ORDER — CYANOCOBALAMIN 500 MCG PO TABS
500.0000 ug | ORAL_TABLET | ORAL | Status: AC
  Administered 2011-06-16: 500 ug via ORAL
  Filled 2011-06-16: qty 1

## 2011-06-16 MED ORDER — WARFARIN - PHARMACIST DOSING INPATIENT: Freq: Every day | Status: DC

## 2011-06-16 MED ORDER — ROPINIROLE HCL 1 MG PO TABS
3.0000 mg | ORAL_TABLET | Freq: Every day | ORAL | Status: DC
  Administered 2011-06-16 – 2011-06-18 (×3): 3 mg via ORAL
  Filled 2011-06-16 (×4): qty 3

## 2011-06-16 MED ORDER — SIMVASTATIN 20 MG PO TABS
20.0000 mg | ORAL_TABLET | Freq: Every day | ORAL | Status: DC
  Administered 2011-06-16 – 2011-06-18 (×3): 20 mg via ORAL
  Filled 2011-06-16 (×4): qty 1

## 2011-06-16 MED ORDER — ASPIRIN EC 81 MG PO TBEC
81.0000 mg | DELAYED_RELEASE_TABLET | Freq: Every day | ORAL | Status: DC
  Administered 2011-06-17: 81 mg via ORAL
  Filled 2011-06-16 (×2): qty 1

## 2011-06-16 NOTE — ED Notes (Signed)
Patient presents with substernal chest pain with radiation to left breast and left backside. Patient has 2 stents placed, last one was placed 2 weeks ago. Patient reporting shortness of breath, especially upon deep inspiration.

## 2011-06-16 NOTE — Progress Notes (Signed)
ANTICOAGULATION CONSULT NOTE - Follow Up Consult  Pharmacy Consult for heparin Indication: LV apical thrombus  Labs:  Basename 06/16/11 2241 06/16/11 2026 06/16/11 1530 06/16/11 1029 06/16/11 1016  HGB -- -- -- -- 12.6  HCT -- -- -- -- 40.5  PLT -- -- -- -- 295  APTT -- -- -- -- --  LABPROT -- -- -- -- 16.2*  INR -- -- -- -- 1.27  HEPARINUNFRC 0.50 -- -- -- --  CREATININE -- -- -- -- 0.74  CKTOTAL -- 50 57 -- --  CKMB -- 1.3 1.4 -- --  TROPONINI -- <0.30 <0.30 <0.30 --    Assessment/Plan: 51yo female therapeutic on heparin with initial dosing for apical thrombus.  Will continue gtt at current rate and confirm stable with am labs.   Colleen Can PharmD BCPS 06/16/2011,11:49 PM

## 2011-06-16 NOTE — ED Notes (Signed)
Pt was received to POD 8 with c/o chest pain onset yesterday characterized as sharp radiating to the back and numbness to both arms. Pt was discharged last Friday, had a stent placed during that admission. Pt is attached to the cardiac monitor. Pt is NAD

## 2011-06-16 NOTE — Progress Notes (Signed)
ANTICOAGULATION CONSULT NOTE - Initial Consult  Pharmacy Consult for Heparin/Coumadin Indication: LV apical thrombus  No Known Allergies  Patient Measurements:   Heparin Dosing Weight: 78  Vital Signs: Temp: 98.4 F (36.9 C) (04/15 1423) Temp src: Oral (04/15 1423) BP: 111/62 mmHg (04/15 1423) Pulse Rate: 90  (04/15 1100)  Labs:  Basename 06/16/11 1029 06/16/11 1016 06/13/11 1605  HGB -- 12.6 --  HCT -- 40.5 --  PLT -- 295 --  APTT -- -- --  LABPROT -- 16.2* 33.4*  INR -- 1.27 3.22*  HEPARINUNFRC -- -- --  CREATININE -- 0.74 --  CKTOTAL -- -- --  CKMB -- -- --  TROPONINI <0.30 -- --   The CrCl is unknown because both a height and weight (above a minimum accepted value) are required for this calculation.  Medical History: Past Medical History  Diagnosis Date  . Anxiety   . Spinal stenosis   . Fibromyalgia   . Restless legs syndrome   . Tobacco abuse     a. reports quitting 12/2010  . Pericarditis     a. In setting of after anterior MI 03/2011  . Sleep apnea   . Coronary artery disease     a.  03/05/2011 - Ant MI - LAD stented w/ 2.75 x 16 mm Promus  DES - course complicated by Pericarditis, re-look cath ok. 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  Proximal area stented with 2.75x64mm Promus DES  . GERD (gastroesophageal reflux disease)   . 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 Pleural effusion, left     a. Felt related to Dressler's s/p thoracentesis 03/27/11 (reactive cells only)  . Hypertension   . Left ventricular apical thrombus     a. Noted on echo 06/07/2011.  NL EF;  b Coumadin initiated 06/2011  . Myocardial infarction     Assessment: 51 yo F recently discharged after receiving a stent to the LAD and being started on Coumadin for apical thrombus.  INR subtherapeutic at 1.27.  Pharmacy consulted to manage heparin and Coumadin.  Spoke with patient and she has not had any Coumadin since she was  in the hospital (6 mg on 4/11).  She did pick up a prescription for Coumadin today (5mg  tablets), but has not taken any because she was instructed not to until she was notified (INR was supratherapeutic at 3.22 on 4/12).    Goal of Therapy:  INR 1.7-2.2 (from Dr. Rosalyn Charters note 2/2 DAPT) Heparin level 0.3-0.7   Plan:  1.  Heparin 4000 units IV x 1 now 2.  Begin heparin gtt at 1300 units/hr (was previous therapeutic rate) 3.  Heparin level in 6 hrs 4.  Daily heparin level, CBC, INR 5.  Coumadin 5 mg po x 1 tonight  Rolland Porter, [place].D., BCPS Clinical Pharmacist Pager: [phone]

## 2011-06-16 NOTE — ED Notes (Signed)
Evelena Peat PAC at the bedside to examine the patient.

## 2011-06-16 NOTE — ED Notes (Signed)
[id number] Ready

## 2011-06-16 NOTE — Consult Note (Addendum)
CARDIOLOGY CONSULT NOTE   Patient ID: ALINNA SIPLE MRN: [id number] DOB/AGE: 1960-10-09 51 y.o.  Admit date: 06/16/2011  Primary Physician   Georgiann Hahn, MD, MD Primary Cardiologist   Dr. Riley Kill  Reason for Consultation  Chest pain  WUJ:[id number] C Rekowski is a 51 y.o. female with a history of CAD. She was discharged from the hospital 4/12 after being admitted with chest pain, receiving a stent to the LAD and being started on coumadin for an apical thrombus.   She presents today to [facility] ED with substernal CP that radiates under L breast, into back, L neck, and bilateral arms. She describes the pain as achy, throbbing, sharp, tender, tight, with pressure and as heavy in all the areas mentioned. The pain sensations are different at different times. Pain is worse with movement, cough and deep inspiration. She also reports having had nausea with vomiting and cold sweats. Currently, pain is 10/10, sharp and heavy. She is not short of breath, nauseated or diaphoretic.    Past Medical History  Diagnosis Date  . Anxiety   . Spinal stenosis   . Fibromyalgia   . Restless legs syndrome   . Tobacco abuse     a. reports quitting 12/2010  . Pericarditis     a. In setting of after anterior MI 03/2011  . Sleep apnea   . Coronary artery disease     a.  03/05/2011 - Ant MI - LAD stented w/ 2.75 x 16 mm Promus  DES - course complicated by Pericarditis, re-look cath ok. 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  Proximal area stented with 2.75x43mm Promus DES  . GERD (gastroesophageal reflux disease)   . 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 Pleural effusion, left     a. Felt related to Dressler's s/p thoracentesis 03/27/11 (reactive cells only)  . Hypertension   . Left ventricular apical thrombus     a. Noted on echo 06/07/2011.  NL EF;  b Coumadin initiated 06/2011  . Myocardial infarction      Past Surgical History  Procedure Date    . Hand surgery   . Cesarean section   . Cardiac catheterization   . Coronary angioplasty   . Tubal ligation   . Coronary artery stent lad 06/06/2011    No Known Allergies  I have reviewed the patient's current medications (from AVS 4/12) Medication List  As of 06/16/2011 12:18 PM   ASK your doctor about these medications         aspirin 81 MG EC tablet   Take 1 tablet (81 mg total) by mouth daily.      citalopram 10 MG tablet   Commonly known as: CELEXA   Take 10 mg by mouth daily.      clopidogrel 75 MG tablet   Commonly known as: PLAVIX   Take 75 mg by mouth daily.      cyanocobalamin 500 MCG tablet   Take 500 mcg by mouth daily.      cyanocobalamin 1000 MCG/ML injection   Commonly known as: (VITAMIN B-12)   Inject 1,000 mcg into the muscle every 7 (seven) days. Takes on Saturday      DOC-Q-LACE PO   Take 100 mg by mouth as needed.      ferrous fumarate-iron polysaccharide complex 162-115.2 MG Caps   Commonly known as: TANDEM   Take 1 capsule by mouth once a week. Take on  wednesday      gabapentin 300 MG capsule   Commonly known as: NEURONTIN   Take 600 mg by mouth 3 (three) times daily.      HYDROcodone-acetaminophen 10-500 MG per tablet   Commonly known as: LORTAB   Take 1 tablet by mouth every 4 (four) hours as needed. For pain      metoprolol tartrate 25 MG tablet   Commonly known as: LOPRESSOR   Take 25 mg by mouth 2 (two) times daily.      Milnacipran 50 MG Tabs   Commonly known as: SAVELLA   Take 100 mg by mouth 2 (two) times daily.      neomycin-bacitracin-polymyxin Oint   Commonly known as: NEOSPORIN   Apply 1 application topically as needed.      nitroGLYCERIN 0.4 MG SL tablet   Commonly known as: NITROSTAT   Place 0.4 mg under the tongue every 5 (five) minutes as needed. For chest pain      pantoprazole 40 MG tablet   Commonly known as: PROTONIX   Take 40 mg by mouth daily at 6 (six) AM.      rOPINIRole 3 MG tablet   Commonly known as:  REQUIP   Take 3 mg by mouth at bedtime.      simvastatin 20 MG tablet   Commonly known as: ZOCOR   Take 20 mg by mouth daily at 6 PM.      tiZANidine 4 MG tablet   Commonly known as: ZANAFLEX   Take 6 mg by mouth 3 (three) times daily. 1 and 1/2 tablet Three times daily      Vitamin D (Ergocalciferol) 50000 UNITS Caps   Commonly known as: DRISDOL   Take 50,000 Units by mouth every 7 (seven) days. Take on wednesday      zolpidem 10 MG tablet   Commonly known as: AMBIEN   Take 10 mg by mouth at bedtime as needed. For sleep             History   Social History  . Marital Status: Married    Spouse Name: N/A    Number of Children: N/A  . Years of Education: N/A   Occupational History  . Not on file.   Social History Main Topics  . Smoking status: Former Smoker    Types: Cigarettes    Quit date: 12/04/2010  . Smokeless tobacco: Never Used   Comment: Quit 3 months ago  . Alcohol Use: No  . Drug Use: No  . Sexually Active: Not Currently   Other Topics Concern  . Not on file   Social History Narrative   [place] Mar[place]. Doesn't currently work.     Family History  Problem Relation Age of Onset  . Coronary artery disease Father   . Coronary artery disease Sister     Posey Rea of age, but sister is younger     ROS:  Full 14 point review of systems complete and found to be negative unless listed above.  Physical Exam: Blood pressure 108/56, pulse 90, temperature 98.3 F (36.8 C), temperature source Oral, resp. rate 20, last menstrual period 04/15/2011, SpO2 100.00%.  General: Well developed, well nourished, female in no acute distress, lying supine on stretcher. Head: Eyes PERRLA, No xanthomas. Normocephalic and atraumatic, oropharynx without edema or exudate. Dentition-poor with missing teeth. Lungs: CTA bilaterally. Expansion equal bilaterally. Respirations equal, even and unlabored. No wheezes, rhonchi or crackles. Heart: Chest wall  very tender  to palpation. Pulses are 2+ BUE, 1+ BLE.  HRRR S1 S2, no rub/gallop, or murmur. Non-displaced PMI. No heaves, lifts or thrills.  Neck: Supple. No carotid bruit. No lymphadenopathy.  No JVD. Trachea midline. No thyromegaly. Abdomen: Obese, bowel sounds hypoactive, abdomen soft and non-tender without masses or hernias noted. Msk:  Tender to palpation in all joints and muscles. Baseline debility-pt uses cane to ambulate. No cva tenderness. No joint deformities or effusions. Extremities: Mottling noted to bilat feet with cyanosis to toes of L foot. Cap refil >3sec in bilat toes. Bilat feet/toes very tender to palpation. No clubbing. No edema. Cap refil <3 sec in BUE.  Neuro: Alert and oriented X 3. No focal deficits noted. Psych:  Aggitated, wants to go home. Speaks with loud tone. Behaves Impulsive and tests boundaries. Normal timbre of speech. Skin: No rashes or lesions noted.  Labs:   Lab Results  Component Value Date   WBC 8.4 06/16/2011   HGB 12.6 06/16/2011   HCT 40.5 06/16/2011   MCV 84.9 06/16/2011   PLT 295 06/16/2011    Basename 06/16/11 1016  INR 1.27    Lab 06/16/11 1016 06/11/11 0515  NA 142 --  K 4.0 --  CL 109 --  CO2 20 --  BUN 12 --  CREATININE 0.74 --  CALCIUM 8.9 --  PROT -- 6.6  BILITOT -- 0.1*  ALKPHOS -- 68  ALT -- 23  AST -- 19  GLUCOSE 101* --    Basename 06/16/11 1029  CKTOTAL --  CKMB --  TROPONINI <0.30    Basename 06/16/11 1032  TROPIPOC 0.00   Pro B Natriuretic peptide (BNP)  Date/Time Value Range Status  06/16/2011 10:18 AM 267.0* 0-125 (pg/mL) Final  03/27/2011  4:58 AM 1142.0* 0-125 (pg/mL) Final   Lab Results  Component Value Date   CHOL 237* 03/05/2011   HDL 34* 03/05/2011   LDLCALC 177* 03/05/2011   TRIG 128 03/05/2011   Echo: 06/07/2011 Study Conclusions - Left ventricle: The cavity size was normal. Wall thickness was normal. The estimated ejection fraction was 55%. There is akinesis of the distal anteroseptal and  apical myocardium. There is evidence of a mural thrombus noted in this region, measuring approximately 1 cm in largest dimension. This is relatively well seen using off axis apical views. Comparison with prior echocardiographic studies finds no clear evidence of prior thrombus, although fewer views are available to review from the previous studies. Left ventricular diastolic function parameters were normal. - Mitral valve: Mild regurgitation. - Left atrium: The atrium was mildly dilated. - Tricuspid valve: Trivial regurgitation. - Pulmonary arteries: PA peak pressure: 29mm Hg (S). - Pericardium, extracardiac: There was no pericardial effusion.  ECG:  06/16/2011 16-Jun-2011 09:28:02  Sinus tachycardia Possible Inferior infarct , age undetermined Cannot rule out Anterior infarct , age undetermined Abnormal ECG 66mm/s 42mm/mV 100Hz  8.0.1 12SL 237 CID: 1 Referred by: Unconfirmed Vent. rate 109 BPM PR interval 152 ms QRS duration 82 ms QT/QTc 310/417 ms P-R-T axes 33 40 67  Radiology:   Dg Chest Portable 1 View 06/16/2011  *RADIOLOGY REPORT*  Clinical Data: Chest pain, recent stents  PORTABLE CHEST - 1 VIEW  Comparison: CT chest dated 06/10/2011  Findings: Lungs are essentially clear. No pleural effusion or pneumothorax.  The heart is normal in size for inspiration.  Coronary stent.  IMPRESSION: No evidence of acute cardiopulmonary disease.  Original Report Authenticated By: Charline Bills, M.D.   ASSESSMENT AND PLAN:   The patient was seen today  by Dr Jens Som, the patient evaluated and the data reviewed.  Principal Problem:  *Chest pain Active Problems:  Fibromyalgia  Pericarditis, acute  Dressler's syndrome  Coronary artery disease    Signed: Theodore Demark 06/16/2011, 12:16 PM Co-Sign MD As above, patient seen and examined. 51 year old female with past medical history of coronary artery disease, apical thrombus on Coumadin, fibromyalgia and chronic back pain with chest  pain. Patient suffered a myocardial infarction in January of 2013. Patient had PCI of the LAD with a drug-eluting stent. Her course was complicated by pleuropericarditis. She required thoracentesis. She has continued to have chest pain and underwent repeat cardiac catheterization earlier this month. She had a second drug-eluting stent to her LAD. An echocardiogram was also performed and revealed an ejection fraction of 55%. There was akinesis of the distal anteroseptal and apical myocardium with a mural thrombus. The patient was placed on Coumadin. She was discharged 3 days ago. She returns today with recurrent chest pain. Her chest pain began yesterday approximately 22 hours prior to this evaluation. It was substernal radiating under her left breast. There was associated bilateral upper extremity tightness. There is also pain in her neck. She describes shortness of breath diaphoresis and nausea. The pain increases with certain movements and lying flat. She therefore presented to the emergency room. Her electrocardiogram shows sinus rhythm at a rate of 109. There is a prior inferolateral infarct with ST elevation unchanged from previous. Initial enzymes are negative. Plan admit and cycle enzymes although the pain is not consistent with ischemia. Question contribution from pericarditis. Will need to review with Dr. Riley Kill. Question short course of nonsteroidals versus prednisone versus colchicine. I would not pursue further ischemia evaluation. Olga Millers  Patient also with pain at cath site right groin; some tenderness to palpation; no hematoma and no bruit. Olga Millers

## 2011-06-16 NOTE — Telephone Encounter (Signed)
Received message from NP at hospital over weekend to set pt up with appt for INR check on 4/15.  Called patient to set up an appt.  As we were talking, pt reported chest pain that radiated to her back and down her arm as well as severe pain in her leg near her cath site.  Asked pt if this was similar to the pain she experienced prior to recent hospitalization and she said yes. She has had 3 previous caths and states that her groin has never hurt like it does today.  Pt was already on her way to [place] from [place].  Suggested she go straight to ER for evaluation rather than coming to the office.  She agrees and will go to the ER for further evaluation.

## 2011-06-17 DIAGNOSIS — R079 Chest pain, unspecified: Secondary | ICD-10-CM

## 2011-06-17 LAB — PROTIME-INR
INR: 1.2 (ref 0.00–1.49)
Prothrombin Time: 15.5 seconds — ABNORMAL HIGH (ref 11.6–15.2)

## 2011-06-17 LAB — PROTEIN ELECTROPHORESIS, SERUM
Beta 2: 5.2 % (ref 3.2–6.5)
Beta Globulin: 6.7 % (ref 4.7–7.2)
Gamma Globulin: 15.9 % (ref 11.1–18.8)
M-Spike, %: NOT DETECTED g/dL

## 2011-06-17 LAB — CULTURE, BLOOD (ROUTINE X 2)
Culture  Setup Time: 201304101821
Culture: NO GROWTH

## 2011-06-17 LAB — CBC
HCT: 36.3 % (ref 36.0–46.0)
MCH: 26.8 pg (ref 26.0–34.0)
MCHC: 31.4 g/dL (ref 30.0–36.0)
RDW: 17 % — ABNORMAL HIGH (ref 11.5–15.5)

## 2011-06-17 LAB — HEPARIN LEVEL (UNFRACTIONATED): Heparin Unfractionated: 0.36 IU/mL (ref 0.30–0.70)

## 2011-06-17 LAB — CARDIAC PANEL(CRET KIN+CKTOT+MB+TROPI): CK, MB: 1.3 ng/mL (ref 0.3–4.0)

## 2011-06-17 MED ORDER — NAPROXEN 250 MG PO TABS
250.0000 mg | ORAL_TABLET | Freq: Two times a day (BID) | ORAL | Status: DC
  Administered 2011-06-17 – 2011-06-18 (×4): 250 mg via ORAL
  Filled 2011-06-17 (×8): qty 1

## 2011-06-17 MED ORDER — WARFARIN SODIUM 5 MG PO TABS
5.0000 mg | ORAL_TABLET | Freq: Once | ORAL | Status: AC
  Administered 2011-06-17: 5 mg via ORAL
  Filled 2011-06-17: qty 1

## 2011-06-17 NOTE — Progress Notes (Signed)
ANTICOAGULATION CONSULT NOTE - Follow Up Consult  Pharmacy Consult for Warfarin Indication: LV apical thrombus  No Known Allergies  Patient Measurements:   Actual Wt: 101.7 kg Ht: 63 inches IBW: 52.4 kg Heparin Dosing Weight: 76.4 kg  Vital Signs: Temp: 97.8 F (36.6 C) (04/16 0500) Temp src: Oral (04/16 0500) BP: 100/67 mmHg (04/16 0500) Pulse Rate: 88  (04/16 0500)  Labs:  Basename 06/17/11 0304 06/16/11 2241 06/16/11 2026 06/16/11 1530 06/16/11 1016  HGB 11.4* -- -- -- 12.6  HCT 36.3 -- -- -- 40.5  PLT 268 -- -- -- 295  APTT -- -- -- -- --  LABPROT 15.5* -- -- -- 16.2*  INR 1.20 -- -- -- 1.27  HEPARINUNFRC 0.36 0.50 -- -- --  CREATININE -- -- -- -- 0.74  CKTOTAL 46 -- 50 57 --  CKMB 1.3 -- 1.3 1.4 --  TROPONINI <0.30 -- <0.30 <0.30 --   The CrCl is unknown because both a height and weight (above a minimum accepted value) are required for this calculation.   Assessment: 51 y.o. F on heparin bridge to therapeutic INR with warfarin for LV apical thrombus noticed on admission earlier this month. The patient was recently discharged on 4/12 with a supratherapeutic INR and was to be followed up on 4/15 at the warfarin clinic -- however was re-admitted to the hospital due to chest pain. Heparin level this morning is therapeutic (HL 0.36, goal of 0.3-0.7) and INR remains SUBtherapeutic despite resuming warfarin last night (INR 1.2 << 1.27). Given trends from last admission -- patient will likely take a couple of days to respond to warfarin doses. Hgb/Hct/Plt slight drop, no s/sx of bleeding noted. Heparin infusing at appropriate rate. Patient re-educated on warfarin this admission.  Goal of Therapy:  INR 1.7-2.2 (from Dr. Rosalyn Charters note 2/2 DAPT)  Heparin level 0.3-0.7   Plan:  1. Continue heparin at current rate of 1300 units/hr (13 ml/hr) 2. Warfarin 5 mg x 1 dose at 1800 today 3. Will continue to monitor for any signs/symptoms of bleeding and will follow up with heparin  level and PT/INR in the a.m.   Georgina Pillion, PharmD, BCPS Clinical Pharmacist Pager: [phone] 06/17/2011 9:52 AM

## 2011-06-17 NOTE — Progress Notes (Signed)
Subjective:  Patient feels better.  Most of her pain seems lateral.  When it hit her on home it was sudden, sharp.  She was told by the coumadin clinic to come straight to the ER.    Objective:  Vital Signs in the last 24 hours: Temp:  [97.8 F (36.6 C)-98.4 F (36.9 C)] 97.8 F (36.6 C) (04/16 0500) Pulse Rate:  [69-111] 88  (04/16 0500) Resp:  [15-22] 18  (04/16 0500) BP: (100-161)/(56-90) 100/67 mmHg (04/16 0500) SpO2:  [95 %-100 %] 95 % (04/16 0500)  Intake/Output from previous day:     Physical Exam: General: Well developed, well nourished, in no acute distress. Head:  Normocephalic and atraumatic. Lungs: Clear to auscultation and percussion. Heart: Normal S1 and S2.  No murmur, rubs or gallops.  Pulses: Pulses normal in all 4 extremities. Extremities: No clubbing or cyanosis. No edema.  Groin looks good.  Some tenderness, no mass or bruit.   Neurologic: Alert and oriented x 3.    Lab Results:  Basename 06/17/11 0304 06/16/11 1016  WBC 7.6 8.4  HGB 11.4* 12.6  PLT 268 295    Basename 06/16/11 1016  NA 142  K 4.0  CL 109  CO2 20  GLUCOSE 101*  BUN 12  CREATININE 0.74    Basename 06/17/11 0304 06/16/11 2026  TROPONINI <0.30 <0.30   Hepatic Function Panel No results found for this basename: PROT,ALBUMIN,AST,ALT,ALKPHOS,BILITOT,BILIDIR,IBILI in the last 72 hours No results found for this basename: CHOL in the last 72 hours No results found for this basename: PROTIME in the last 72 hours  Imaging: Dg Chest Portable 1 View  06/16/2011  *RADIOLOGY REPORT*  Clinical Data: Chest pain, recent stents  PORTABLE CHEST - 1 VIEW  Comparison: CT chest dated 06/10/2011  Findings: Lungs are essentially clear. No pleural effusion or pneumothorax.  The heart is normal in size for inspiration.  Coronary stent.  IMPRESSION: No evidence of acute cardiopulmonary disease.  Original Report Authenticated By: Charline Bills, M.D.    EKG:  Largely unchanged.  NSR.  ST and T wave  changes, chronic post MI.    Cardiac Studies:  Enzymes negative times three  Assessment/Plan:  Patient Active Hospital Problem List: Chest pain (06/16/2011)   Assessment: most likely non cardiac   Plan: she has a lot of MS type pain in addition to what she has had making evaluation quite difficult.  She did seem better with a NSAID  (Naprosyn), and while contraindicated generally, a low dose of this might be helpful.  Fibromyalgia (03/06/2011)   Assessment: long standing dx   Plan: observe Pericarditis, acute (03/13/2011)   Assessment: seems largely resolved.  Was Dressler's type of presentation   Plan: continue obs  Coronary artery disease (06/05/2011)   Assessment: enzymes neg   Plan: observe  I will repeat P2Y12.  If low, will dc ASA.  Would restart warfarin.  Pharmacy overshot last time.  Her LV thrombus has probably been there awhile  (not observed on original).  Would aim for INR of 1.7, then plan dc.  I do not think she can handle enox at home.  Pharmacy should aim low, not high, and we can settle for 1.7-2.2.        Shawnie Pons, MD, [facility], FSCAI 06/17/2011, 7:03 AM

## 2011-06-17 NOTE — Progress Notes (Signed)
Clinical Social Work Department BRIEF PSYCHOSOCIAL ASSESSMENT 06/17/2011  Patient:  Kristy Sloan, Kristy Sloan     Account Number:  [id number]     Admit date:  06/16/2011  Clinical Social Worker:  Hulan Fray  Date/Time:  06/17/2011 04:15 PM  Referred by:  RN  Date Referred:  06/16/2011 Referred for  Advanced Directives   Other Referral:   Interview type:  Patient Other interview type:    PSYCHOSOCIAL DATA Living Status:  HUSBAND Admitted from facility:   Level of care:   Primary support name:  Aurther Loft Primary support relationship to patient:  SPOUSE Degree of support available:   supportive    CURRENT CONCERNS Current Concerns  Other - See comment   Other Concerns:   Advance directive    SOCIAL WORK ASSESSMENT / PLAN Clinical Social Worker received referral for advance directive request. Patient lives with her husband and daughter. Patient was familiar with the advance directive documents because she had a family member who had completed an advance directive document before. CSW provided patient with advance directive and MOST form to be filled out with her physician. Patient did not have any further questions. CSW will sign off as social work intervention is no longer needed.   Assessment/plan status:  No Further Intervention Required Other assessment/ plan:   Information/referral to community resources:   Engineer, production and MOST form    PATIENT'S/FAMILY'S RESPONSE TO PLAN OF CARE: Patient was appreciative of advance directive packet and CSW's assistance.

## 2011-06-17 NOTE — Progress Notes (Signed)
INITIAL ADULT NUTRITION ASSESSMENT Date: 06/17/2011   Time: 12:16 PM  Reason for Assessment: Nutrition Risk Report  ASSESSMENT: Female 51 y.o.  Dx: Chest pain  Hx:  Past Medical History  Diagnosis Date  . Anxiety   . Spinal stenosis   . Fibromyalgia   . Restless legs syndrome   . Tobacco abuse     a. reports quitting 12/2010  . Pericarditis     a. In setting of after anterior MI 03/2011  . Sleep apnea   . Coronary artery disease     a.  03/05/2011 - Ant MI - LAD stented w/ 2.75 x 16 mm Promus  DES - course complicated by Pericarditis, re-look cath ok. 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  Proximal area stented with 2.75x40mm Promus DES  . GERD (gastroesophageal reflux disease)   . 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 Pleural effusion, left     a. Felt related to Dressler's s/p thoracentesis 03/27/11 (reactive cells only)  . Hypertension   . Left ventricular apical thrombus     a. Noted on echo 06/07/2011.  NL EF;  b Coumadin initiated 06/2011  . High cholesterol   . Peripheral vascular disease   . Heart murmur   . CHF (congestive heart failure)   . Angina   . 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 Shortness of breath 06/16/11    "all the time"  . Umbilical hernia     unrepaired (06/16/11)  . Chronic headache 06/16/11    "every other day or so"  . Depression     Related Meds:     . aspirin  81 mg Oral To Major  . aspirin  81 mg Oral Daily  . citalopram  10 mg Oral Daily  . citalopram  10 mg Oral To Major  . clopidogrel  75 mg Oral Daily  . cyanocobalamin  1,000 mcg Intramuscular Q Sat-1800  . cyanocobalamin  500 mcg Oral Daily  . vitamin B-12  500 mcg Oral To Major  . docusate sodium  100 mg Oral Daily  . docusate sodium  100 mg Oral To Major  . gabapentin  600 mg Oral TID  . gabapentin  600 mg Oral To Major  . heparin  4,000 Units Intravenous Once  . iron polysaccharides  150 mg Oral Q Wed  .  metoprolol tartrate  25 mg Oral BID  . metoprolol tartrate  25 mg Oral To Major  . Milnacipran  100 mg Oral BID  . naproxen  250 mg Oral BID WC  . pantoprazole  40 mg Oral Q0600  . rOPINIRole  3 mg Oral QHS  . simvastatin  20 mg Oral q1800  . sodium chloride  3 mL Intravenous Q12H  . tiZANidine  6 mg Oral TID  . tiZANidine  6 mg Oral To Major  . Vitamin D (Ergocalciferol)  50,000 Units Oral To Major  . warfarin  5 mg Oral To Major  . warfarin  5 mg Oral ONCE-1800  . Warfarin - Pharmacist Dosing Inpatient   Does not apply q1800  . DISCONTD: aspirin EC  81 mg Oral Daily  . DISCONTD: ferrous fumarate  1 tablet Oral To Major  . DISCONTD: ferrous fumarate-iron polysaccharide complex  1 capsule Oral Weekly  . DISCONTD: Milnacipran  100 mg Oral BID    Ht: 5'3 (160 cm)  Wt: 101.7 kg (224 lb)  Ideal Wt: 52.2 kg %  Ideal Wt: 194%  Usual Wt: 238 lb  % Usual Wt: 94%  BMI = 39.7 kg/m2  Food/Nutrition Related Hx: problems chewing or swallowing foods and/or liquids per admission nutrition screen  Labs:  CMP     Component Value Date/Time   NA 142 06/16/2011 1016   K 4.0 06/16/2011 1016   CL 109 06/16/2011 1016   CO2 20 06/16/2011 1016   GLUCOSE 101* 06/16/2011 1016   BUN 12 06/16/2011 1016   CREATININE 0.74 06/16/2011 1016   CALCIUM 8.9 06/16/2011 1016   PROT 6.6 06/11/2011 0515   ALBUMIN 3.3* 06/11/2011 0515   AST 19 06/11/2011 0515   ALT 23 06/11/2011 0515   ALKPHOS 68 06/11/2011 0515   BILITOT 0.1* 06/11/2011 0515   GFRNONAA >90 06/16/2011 1016   GFRAA >90 06/16/2011 1016     Intake/Output Summary (Last 24 hours) at 06/17/11 1222 Last data filed at 06/17/11 0900  Gross per 24 hour  Intake    480 ml  Output      1 ml  Net    479 ml    Diet Order: Heart Healthy  Supplements/Tube Feeding: N/A  IVF:    heparin Last Rate: 1,300 Units/hr (06/17/11 0803)    Estimated Nutritional Needs:   Kcal: 1600-1800 Protein: 80-90 gm Fluid: 1.6-1.8 L  RD spoke with pt re: nutrition hx --  states her appetite is good and also was good prior to hospitalization; current PO intake is 100% per flowsheet records; PTA pt reported consuming her regular food choices and have episodes of vomiting afterwards; also experienced headaches & diarrhea; reports weight loss of approximately 18 lbs; RD feels this weight loss is desirable given obesity; also reports swallowing difficulty with foods getting "stuck" ? related to GERD  NUTRITION DIAGNOSIS: No nutrition dx at this time  RELATED TO: ---  AS EVIDENCE BY: ---  MONITORING/EVALUATION(Goals): Goal: meet >90% of estimated nutrition needs Monitor: PO intake, weight, labs, I/O's  EDUCATION NEEDS: -No education needs identified at this time  INTERVENTION:  No nutrition intervention at this time  RD to follow   Dietitian #: [phone]  DOCUMENTATION CODES Per approved criteria  -Obesity Unspecified    Alger Memos 06/17/2011, 12:16 PM

## 2011-06-18 DIAGNOSIS — I219 Acute myocardial infarction, unspecified: Secondary | ICD-10-CM

## 2011-06-18 DIAGNOSIS — IMO0001 Reserved for inherently not codable concepts without codable children: Secondary | ICD-10-CM

## 2011-06-18 DIAGNOSIS — I241 Dressler's syndrome: Secondary | ICD-10-CM

## 2011-06-18 DIAGNOSIS — I2510 Atherosclerotic heart disease of native coronary artery without angina pectoris: Secondary | ICD-10-CM

## 2011-06-18 LAB — CBC
HCT: 36.1 % (ref 36.0–46.0)
MCH: 26.8 pg (ref 26.0–34.0)
MCV: 84.7 fL (ref 78.0–100.0)
Platelets: 265 10*3/uL (ref 150–400)
RBC: 4.26 MIL/uL (ref 3.87–5.11)
WBC: 7 10*3/uL (ref 4.0–10.5)

## 2011-06-18 MED ORDER — OFF THE BEAT BOOK
Freq: Once | Status: DC
  Filled 2011-06-18: qty 1

## 2011-06-18 MED ORDER — ASPIRIN EC 81 MG PO TBEC
81.0000 mg | DELAYED_RELEASE_TABLET | Freq: Every day | ORAL | Status: DC
  Administered 2011-06-18 – 2011-06-19 (×2): 81 mg via ORAL
  Filled 2011-06-18 (×4): qty 1

## 2011-06-18 MED ORDER — WARFARIN SODIUM 5 MG PO TABS
5.0000 mg | ORAL_TABLET | Freq: Once | ORAL | Status: AC
  Administered 2011-06-18: 5 mg via ORAL
  Filled 2011-06-18: qty 1

## 2011-06-18 MED ORDER — ASPIRIN EC 81 MG PO TBEC
81.0000 mg | DELAYED_RELEASE_TABLET | Freq: Every day | ORAL | Status: DC
  Filled 2011-06-18 (×2): qty 1

## 2011-06-18 NOTE — Progress Notes (Signed)
   CARE MANAGEMENT NOTE 06/18/2011  Patient:  Kristy Sloan, Kristy Sloan   Account Number:  [id number]  Date Initiated:  06/18/2011  Documentation initiated by:  GRAVES-BIGELOW,Ashleah Valtierra  Subjective/Objective Assessment:   Pt presented with cp. Recently discharged after receiving a stent to the LAD and being started on Coumadin for apical thrombus. [facility] [place]. Pt still in process of trying to get disability.     Action/Plan:   Pt had questions on cost of xanax at pharmacy and for 60 total 13.99 and 30 total for 8.99. Pt on heparin bridge to therapeutic INR with warfarin for LV apical thrombus .   Anticipated DC Date:  06/21/2011   Anticipated DC Plan:  HOME W HOME HEALTH SERVICES      DC Planning Services  CM consult      Choice offered to / List presented to:             Status of service:  In process, will continue to follow Medicare Important Message given?   (If response is "NO", the following Medicare IM given date fields will be blank) Date Medicare IM given:   Date Additional Medicare IM given:    Discharge Disposition:    Per UR Regulation:    If discussed at Long Length of Stay Meetings, dates discussed:    Comments:  06-18-11 1039 Tomi Bamberger, RN,BSN [phone] CM will continue to monitor for d/c disposition needs.

## 2011-06-18 NOTE — Progress Notes (Signed)
ANTICOAGULATION CONSULT NOTE - Follow Up Consult  Pharmacy Consult for Warfarin Indication: LV apical thrombus  No Known Allergies  Patient Measurements: Height: 5\' 3"  (160 cm) Weight: 220 lb (99.791 kg) IBW/kg (Calculated) : 52.4  Actual Wt: 101.7 kg Ht: 63 inches IBW: 52.4 kg Heparin Dosing Weight: 76.4 kg  Vital Signs: Temp: 97.8 F (36.6 C) (04/17 0600) Temp src: Oral (04/17 0600) BP: 105/66 mmHg (04/17 0600) Pulse Rate: 79  (04/17 0600)  Labs:  Basename 06/18/11 0600 06/17/11 0304 06/16/11 2241 06/16/11 2026 06/16/11 1530 06/16/11 1016  HGB 11.4* 11.4* -- -- -- --  HCT 36.1 36.3 -- -- -- 40.5  PLT 265 268 -- -- -- 295  APTT -- -- -- -- -- --  LABPROT 16.8* 15.5* -- -- -- 16.2*  INR 1.34 1.20 -- -- -- 1.27  HEPARINUNFRC 0.45 0.36 0.50 -- -- --  CREATININE -- -- -- -- -- 0.74  CKTOTAL -- 46 -- 50 57 --  CKMB -- 1.3 -- 1.3 1.4 --  TROPONINI -- <0.30 -- <0.30 <0.30 --   Estimated Creatinine Clearance: 96.9 ml/min (by C-G formula based on Cr of 0.74).   Assessment: 51 y.o. F on heparin bridge to therapeutic INR with warfarin for LV apical thrombus noticed on admission earlier this month. The patient was recently discharged on 4/12 with a supratherapeutic INR and was to be followed up on 4/15 at the warfarin clinic -- however was re-admitted to the hospital due to chest pain. Heparin level this morning is therapeutic (HL 0.45, goal of 0.3-0.7) and INR remains SUBtherapeutic though trending up (INR 1.34 << 1.2). Given trends from last admission -- patient will likely take a couple of days to respond to warfarin doses. Hgb/Hct/Plt stable, no s/sx of bleeding noted. Heparin infusing at appropriate rate. Patient re-educated on warfarin this admission.  It has been noted that the patient has been resumed on naproxen to help with her atypical chest pain. ASA has been adjusted to be given ~1 hr prior to naproxen to help reduce the interaction. Despite this, there is still the  potential interaction that naproxen may inhibit the cardioprotective effects of ASA.  Goal of Therapy:  INR 1.7-2.2 (from Dr. Rosalyn Charters note 2/2 DAPT)  Heparin level 0.3-0.7   Plan:  1. Continue heparin at current rate of 1300 units/hr (13 ml/hr) 2. Warfarin 5 mg x 1 dose at 1800 today 3. Will continue to monitor for any signs/symptoms of bleeding and will follow up with heparin level and PT/INR in the a.m.   Georgina Pillion, PharmD, BCPS Clinical Pharmacist Pager: [phone] 06/18/2011 8:21 AM

## 2011-06-18 NOTE — Progress Notes (Signed)
Subjective:  No chest pain, but has not ambulated.  Distinguishing between ischemic pain, MS pain, Fibromyalgia, and latent effects of late presentation related Dressler's not easy.  She does seem better with Naproxen, and I have explained to her my concerns about this.    Objective:  Vital Signs in the last 24 hours: Temp:  [97.1 F (36.2 C)-98 F (36.7 C)] 97.8 F (36.6 C) (04/17 0600) Pulse Rate:  [66-86] 86  (04/17 1038) Resp:  [18-20] 18  (04/17 0600) BP: (100-110)/(63-69) 110/69 mmHg (04/17 1038) SpO2:  [96 %-100 %] 96 % (04/17 0600) Weight:  [220 lb (99.791 kg)] 220 lb (99.791 kg) (04/16 1935)  Intake/Output from previous day: 04/16 0701 - 04/17 0700 In: 840 [P.O.:840] Out: 2 [Urine:2]   Physical Exam: General: Well developed, well nourished, in no acute distress. Head:  Normocephalic and atraumatic. Lungs: Clear to auscultation and percussion. Heart: Normal S1 and S2.  No murmur, rubs or gallops.  Pulses: Pulses normal in all 4 extremities. Extremities: No clubbing or cyanosis. No edema. Neurologic: Alert and oriented x 3.    Lab Results:  Basename 06/18/11 0600 06/17/11 0304  WBC 7.0 7.6  HGB 11.4* 11.4*  PLT 265 268    Basename 06/16/11 1016  NA 142  K 4.0  CL 109  CO2 20  GLUCOSE 101*  BUN 12  CREATININE 0.74    Basename 06/17/11 0304 06/16/11 2026  TROPONINI <0.30 <0.30   Hepatic Function Panel No results found for this basename: PROT,ALBUMIN,AST,ALT,ALKPHOS,BILITOT,BILIDIR,IBILI in the last 72 hours No results found for this basename: CHOL in the last 72 hours No results found for this basename: PROTIME in the last 72 hours  Imaging: No results found.    Assessment/Plan:  Patient Active Hospital Problem List: Chest pain (06/16/2011)   Assessment: I suspect this is mostly MS related   Plan: continue naproxen.  DC ASA  (see Woest trial re: warfarin plavix without ASA for PCI) Fibromyalgia (03/06/2011)   Assessment: stable    Plan: continue  Naproxen Pericarditis, acute (03/13/2011)   Assessment: probably resolved at this point.    Plan: continue NSAIDS Coronary artery disease (06/05/2011)   Assessment: neg enzymes   Plan: home am LV mural thrombus   Shoot for INR of 1.7-2.2   Early coumadin follow up       Kristy Pons, MD, [facility], [facility] 06/18/2011, 1:15 PM

## 2011-06-18 NOTE — Consult Note (Signed)
Pt had been a 1 ppd smoker but quit about 6 months ago and has remained tobacco free. Congratulated and encouraged pt to remain tobacco free. Discussed relapse prevention strategies as well as a discussion on e-cigarettes in detail. Referred to 1-800 quit now for f/u and support. Discussed oral fixation substitutes, second hand smoke and in home smoking policy. Reviewed and gave pt Written education/contact information.

## 2011-06-19 DIAGNOSIS — R079 Chest pain, unspecified: Secondary | ICD-10-CM

## 2011-06-19 LAB — CBC
HCT: 35.6 % — ABNORMAL LOW (ref 36.0–46.0)
Hemoglobin: 11.2 g/dL — ABNORMAL LOW (ref 12.0–15.0)
MCV: 84.4 fL (ref 78.0–100.0)
RDW: 16.6 % — ABNORMAL HIGH (ref 11.5–15.5)
WBC: 6 10*3/uL (ref 4.0–10.5)

## 2011-06-19 LAB — HEPARIN LEVEL (UNFRACTIONATED): Heparin Unfractionated: 0.46 IU/mL (ref 0.30–0.70)

## 2011-06-19 LAB — PROTIME-INR: INR: 1.91 — ABNORMAL HIGH (ref 0.00–1.49)

## 2011-06-19 MED ORDER — WARFARIN SODIUM 2.5 MG PO TABS
2.5000 mg | ORAL_TABLET | Freq: Once | ORAL | Status: DC
  Filled 2011-06-19: qty 1

## 2011-06-19 MED ORDER — ASPIRIN 325 MG PO TBEC: 325.0000 mg | DELAYED_RELEASE_TABLET | Freq: Two times a day (BID) | ORAL | Status: DC

## 2011-06-19 MED ORDER — WARFARIN SODIUM 2.5 MG PO TABS: 2.5000 mg | ORAL_TABLET | Freq: Every day | ORAL | Status: DC

## 2011-06-19 MED ORDER — WARFARIN - PHARMACIST DOSING INPATIENT: Freq: Every day | Status: DC

## 2011-06-19 NOTE — Progress Notes (Signed)
UR Completed. Kristy Sloan, Kristy Sloan [phone]  

## 2011-06-19 NOTE — Progress Notes (Signed)
Pt was provided d/c instructions, education and prescriptions. All questions were answered. Pt verbalizes understanding. Pt was educated on all follow up appointments and educated on the importance of taking all medications as prescribed. IV removed and heart monitor returned to front. Pt appears stable for d/c with no needs at this time. Pt declines wheelchair and is leaving floor by foot. Ramond Craver, RN

## 2011-06-19 NOTE — Plan of Care (Signed)
Problem: Phase III Progression Outcomes Goal: No anginal pain Outcome: Not Met (add Reason) Patient with chronic chest pain

## 2011-06-19 NOTE — Progress Notes (Signed)
Subjective:  She is better and wants to go home.  None of the pain really seems ischemic, and she and I are both in the hall and she is doing well.  She was on 8 Lortabs at home and is trying to wean herself.  She also was on Vicodin for chronic pain syndrome, obviously explaining why her management has been so difficult  She will get frequent pain.    Objective:  Vital Signs in the last 24 hours: Temp:  [97 F (36.1 C)-98.2 F (36.8 C)] 97 F (36.1 C) (04/18 0530) Pulse Rate:  [74-94] 74  (04/18 0530) Resp:  [18-20] 18  (04/18 0530) BP: (98-123)/(56-77) 98/56 mmHg (04/18 0530) SpO2:  [94 %-98 %] 94 % (04/18 0530) Weight:  [228 lb (103.42 kg)] 228 lb (103.42 kg) (04/18 0530)  Intake/Output from previous day: 04/17 0701 - 04/18 0700 In: 1363 [P.O.:1360; I.V.:3] Out: 6 [Urine:5; Stool:1]   Physical Exam: General: Well developed, well nourished, in no acute distress. Head:  Normocephalic and atraumatic. Lungs: Clear to auscultation and percussion. Heart: Normal S1 and S2.  No murmur, rubs or gallops.  Pulses: Pulses normal in all 4 extremities. Extremities: No clubbing or cyanosis. No edema. Neurologic: Alert and oriented x 3.    Lab Results:  Basename 06/19/11 0600 06/18/11 0600  WBC 6.0 7.0  HGB 11.2* 11.4*  PLT 258 265    Basename 06/16/11 1016  NA 142  K 4.0  CL 109  CO2 20  GLUCOSE 101*  BUN 12  CREATININE 0.74    Basename 06/17/11 0304 06/16/11 2026  TROPONINI <0.30 <0.30   Hepatic Function Panel No results found for this basename: PROT,ALBUMIN,AST,ALT,ALKPHOS,BILITOT,BILIDIR,IBILI in the last 72 hours No results found for this basename: CHOL in the last 72 hours No results found for this basename: PROTIME in the last 72 hours  Imaging: No results found.    Assessment/Plan:  Patient Active Hospital Problem List: Chest pain (06/16/2011)   Assessment: Largely resolved.     Plan: I am going to stop her Naprosyn and replace with ASA 325 bid until next  week, then probably stop all together. Fibromyalgia (03/06/2011)   Assessment: see above   Plan: as noted Pericarditis, acute (03/13/2011)   Assessment: none of the symptoms sound like this, although did in prior admission   Plan: continue  Coronary artery disease (06/05/2011)/LV mural thrombus   Assessment: stable   Plan: will continue on plavix/warfarin   Keep warfarin low.  She will take ASA until next week (for MS pain).    Needs coumadin clinic visit.     INR is 1.9 today--went out quickly last time so will hold warfarin today.  Would dc on 2.5 mg and have her seen in our coumadin clinic next week.    Greater than 30 minute dc time.   Need follow up with me next week.         Shawnie Pons, MD, [facility], FSCAI 06/19/2011, 7:52 AM

## 2011-06-19 NOTE — Discharge Summary (Signed)
Discharge Summary   Patient ID: Kristy Sloan MRN: [id number], DOB/AGE: 07-20-1960 51 y.o.  Primary MD: Georgiann Hahn, MD Primary Cardiologist: Shawnie Pons MD  Admit date: 06/16/2011 D/C date:     06/19/2011      Primary Discharge Diagnoses:  1. Chest pain  - noncardiac and most likely musculoskeletal in nature  - Cardiac enzymes negative, no acute EKG changes  - No further cardiac work up  - NSAIDs dc'd, placed on 325mg  ASA BID with plans for stopping at f/u visit next week  - Continued on Plavix and Coumadin (goal INR 1.7-2.2)   Secondary Discharge Diagnoses:  1. Dressler's syndrome  2. Coronary artery disease s/p DES to LAD 03/2011 and 06/06/11 3. LV (left ventricular) mural thrombus on echo 06/07/11, on coumadin 4. Left pleural effusion s/p thoracentesis 03/2011 5. Mixed hyperlipidemia  6. HTN 7. Tobacco abuse - quit 12/2010 8. RLS 9. Fibromyalgia 10. Chronic Back Pain 11. Spinal Stenosis 12. Anxiety 13. Sleep Apnea 14. GERD 15. Cesarean section 16. Tubal Ligation 17. Hand surgery   Allergies No Known Allergies  Diagnostic Studies/Procedures:  None  History of Present Illness: 51 y.o. female w/ the above medical problems who presented to [facility] on 06/16/11 with complaints of chest pain.  She was admitted in January of this year with Anterior MI and subsequently underwent LAD stenting with a DES. Post-procedure course was complicated by ongoing chest pain consistent with Dressler's syndrome and pericarditis. Pt required readmission in mid-January 2/2 ongoing c/p and left pleural effusion, which required thoracentesis. She was again hospitalized and then discharged on 4/12 after being admitted with chest pain, receiving a stent to the prox LAD (previous LAD stent was patent) and being started on coumadin for a newly diagnosed LV apical thrombus. On day of presentation she c/o substernal CP that radiated under L breast, into back, L neck, and bilateral  arms for which she presented to the [facility] ED.  Hospital Course: In the ED, EKG revealed sinus rhythm 109bpm, prior inferolateral infarct w/ ST elevation unchanged from previous EKG. Initial cardiac enzymes negative. CXR was without acute cardiopulmonary abnormalities. She was admitted for further evaluation and treatment.  Cardiac enzymes were cycled and remained negative. It was felt her chest pain was noncardiac and most likely musculoskeletal in nature. No further ischemic evaluation was felt necessary. Coumadin was restarted with goal INR 1.7-2.2. She was able to ambulate without further chest pain. After careful consideration her NSAIDs were stopped and replaced with ASA 325mg  BID with plans to stop at follow up visit next week. She was continued on plavix and coumadin with plans for INR check next week.  She was seen and evaluated by Dr. Riley Kill who felt she was stable for discharge home with plans for follow up as scheduled below.  Discharge Vitals: Blood pressure 116/71, pulse 80, temperature 97.8 F (36.6 C), temperature source Oral, resp. rate 20, height 5\' 3"  (1.6 m), weight 228 lb (103.42 kg), last menstrual period 04/15/2011, SpO2 99.00%.  Labs: Component Value Date   WBC 6.0 06/19/2011   HGB 11.2* 06/19/2011   HCT 35.6* 06/19/2011   MCV 84.4 06/19/2011   PLT 258 06/19/2011    Lab 06/16/11 1016  NA 142  K 4.0  CL 109  CO2 20  BUN 12  CREATININE 0.74  CALCIUM 8.9  GLUCOSE 101*   Basename 06/17/11 0304 06/16/11 2026 06/16/11 1530  CKTOTAL 46 50 57  CKMB 1.3 1.3 1.4  TROPONINI <0.30 <0.30 <0.30  06/16/2011 10:18  Pro B Natriuretic peptide (BNP) 267.0 (H)     06/19/2011 06:00  Prothrombin Time 22.2 (H)  INR 1.91 (H)    Discharge Medications   Medication List  As of 06/19/2011  2:07 PM   TAKE these medications         aspirin 325 MG EC tablet   Take 1 tablet (325 mg total) by mouth 2 (two) times daily.      citalopram 10 MG tablet   Commonly known as: CELEXA   Take  10 mg by mouth daily.      clopidogrel 75 MG tablet   Commonly known as: PLAVIX   Take 75 mg by mouth daily.      cyanocobalamin 500 MCG tablet   Take 500 mcg by mouth daily.      cyanocobalamin 1000 MCG/ML injection   Commonly known as: (VITAMIN B-12)   Inject 1,000 mcg into the muscle every 7 (seven) days. Takes on Saturday      DOC-Q-LACE PO   Take 100 mg by mouth as needed.      ferrous fumarate-iron polysaccharide complex 162-115.2 MG Caps   Commonly known as: TANDEM   Take 1 capsule by mouth once a week. Take on wednesday      gabapentin 300 MG capsule   Commonly known as: NEURONTIN   Take 600 mg by mouth 3 (three) times daily.      HYDROcodone-acetaminophen 10-500 MG per tablet   Commonly known as: LORTAB   Take 1 tablet by mouth every 4 (four) hours as needed. For pain      metoprolol tartrate 25 MG tablet   Commonly known as: LOPRESSOR   Take 25 mg by mouth 2 (two) times daily.      Milnacipran 50 MG Tabs   Commonly known as: SAVELLA   Take 100 mg by mouth 2 (two) times daily.      neomycin-bacitracin-polymyxin Oint   Commonly known as: NEOSPORIN   Apply 1 application topically as needed.      nitroGLYCERIN 0.4 MG SL tablet   Commonly known as: NITROSTAT   Place 0.4 mg under the tongue every 5 (five) minutes as needed. For chest pain      pantoprazole 40 MG tablet   Commonly known as: PROTONIX   Take 40 mg by mouth daily at 6 (six) AM.      rOPINIRole 3 MG tablet   Commonly known as: REQUIP   Take 3 mg by mouth at bedtime.      simvastatin 20 MG tablet   Commonly known as: ZOCOR   Take 20 mg by mouth daily at 6 PM.      tiZANidine 4 MG tablet   Commonly known as: ZANAFLEX   Take 6 mg by mouth 3 (three) times daily. 1 and 1/2 tablet Three times daily      Vitamin D (Ergocalciferol) 50000 UNITS Caps   Commonly known as: DRISDOL   Take 50,000 Units by mouth every 7 (seven) days. Take on wednesday      warfarin 2.5 MG tablet   Commonly known as:  COUMADIN   Take 1 tablet (2.5 mg total) by mouth daily. Start taking 2.5mg  daily tomorrow 06/20/11 and have INR checked on Monday at the coumadin clinic for instructions on further dosing.   Start taking on: 06/20/2011      zolpidem 10 MG tablet   Commonly known as: AMBIEN   Take 10 mg by mouth at bedtime as needed.  For sleep            Disposition   Discharge Orders    Future Appointments: Provider: Department: Dept Phone: Center:   06/23/2011 12:30 PM [facility] [phone] None   06/26/2011 10:15 AM Herby Abraham, MD [facility] [phone] [facility]     Future Orders Please Complete By Expires   Diet - low sodium heart healthy      Increase activity slowly      Discharge instructions      Comments:   *PLEASE REMEMBER TO BRING ALL OF YOUR MEDICATIONS TO EACH OF YOUR FOLLOW-UP OFFICE VISITS.  * Please stop taking NSAIDs (Ibuprofen, Naproxen, Advil, etc)     Follow-up Information    Follow up with [facility] on 06/23/2011. (12:30)    Contact information:   [facility] [address] [place] [address] [phone]       Follow up with Shawnie Pons, MD on 06/26/2011. (10:15)    Contact information:   [facility] [address] [place] [address] [phone]           Outstanding Labs/Studies:  1. INR next week  Duration of Discharge Encounter: Greater than 30 minutes including physician and PA time.  Signed, Preston Garabedian PA-C 06/19/2011, 2:07 PM

## 2011-06-19 NOTE — Progress Notes (Signed)
I agree with the assessments and medication admin by Shary Key student from 7p-7a.

## 2011-06-19 NOTE — Progress Notes (Signed)
ANTICOAGULATION CONSULT NOTE - Follow Up Consult  Pharmacy Consult for Warfarin Indication: LV apical thrombus  No Known Allergies  Patient Measurements: Height: 5\' 3"  (160 cm) Weight: 228 lb (103.42 kg) IBW/kg (Calculated) : 52.4  Actual Wt: 101.7 kg Ht: 63 inches IBW: 52.4 kg Heparin Dosing Weight: 76.4 kg  Vital Signs: Temp: 97 F (36.1 C) (04/18 0530) Temp src: Oral (04/18 0530) BP: 98/56 mmHg (04/18 0530) Pulse Rate: 74  (04/18 0530)  Labs:  Basename 06/19/11 0600 06/18/11 0600 06/17/11 0304 06/16/11 2026 06/16/11 1530 06/16/11 1016  HGB 11.2* 11.4* -- -- -- --  HCT 35.6* 36.1 36.3 -- -- --  PLT 258 265 268 -- -- --  APTT -- -- -- -- -- --  LABPROT 22.2* 16.8* 15.5* -- -- --  INR 1.91* 1.34 1.20 -- -- --  HEPARINUNFRC 0.46 0.45 0.36 -- -- --  CREATININE -- -- -- -- -- 0.74  CKTOTAL -- -- 46 50 57 --  CKMB -- -- 1.3 1.3 1.4 --  TROPONINI -- -- <0.30 <0.30 <0.30 --   Estimated Creatinine Clearance: 98.8 ml/min (by C-G formula based on Cr of 0.74).   Assessment: 51 y.o. F on heparin bridge to warfarin for LV apical thrombus noticed on admission earlier this month. The patient was recently discharged on 4/12 with a supratherapeutic INR and was to be followed up on 4/15 at the warfarin clinic -- however was re-admitted to the hospital due to chest pain. Was not taking warfarin at home due to miscommunication post-discharge.   Heparin level this morning is therapeutic (HL 0.46, goal of 0.3-0.7) and INR remains SUBtherapeutic though trending up 1.91. Patient re-educated on warfarin this admission.  Goal of Therapy:  INR 1.7-2.2 (from Dr. Rosalyn Charters note 2/2 DAPT)  Heparin level 0.3-0.7   Plan:  1. D/c IV heparin in preparation for d/c home 2. Plan d/c on 2.5mg /d Coumadin. 3.Naproxen d/c'd and changed to ASA 325mg  bid until next week.  Elara Cocke S. Merilynn Finland, PharmD, [facility] Clinical Staff Pharmacist Pager [phone]  06/19/2011 8:09 AM

## 2011-06-23 ENCOUNTER — Ambulatory Visit (INDEPENDENT_AMBULATORY_CARE_PROVIDER_SITE_OTHER): Payer: Self-pay

## 2011-06-23 DIAGNOSIS — Z7901 Long term (current) use of anticoagulants: Secondary | ICD-10-CM | POA: Insufficient documentation

## 2011-06-23 DIAGNOSIS — I513 Intracardiac thrombosis, not elsewhere classified: Secondary | ICD-10-CM

## 2011-06-23 DIAGNOSIS — I219 Acute myocardial infarction, unspecified: Secondary | ICD-10-CM

## 2011-06-23 LAB — POCT INR: INR: 1.5

## 2011-06-23 NOTE — Patient Instructions (Signed)

## 2011-06-25 NOTE — ED Provider Notes (Signed)
History     CSN: [id number]  Arrival date & time 06/16/11  0272   First MD Initiated Contact with Patient 06/16/11 1032      Chief Complaint  Patient presents with  . Chest Pain    (Consider location/radiation/quality/duration/timing/severity/associated sxs/prior treatment) HPI Comments: Kristy Sloan is a 51 y.o. female with a history of CAD. She was discharged from the hospital 4/12 after being admitted with chest pain, receiving a stent to the LAD and being started on coumadin for an apical thrombus.   She presents today to [facility] ED with substernal CP that radiates under L breast, into back, L neck, and bilateral arms. She describes the pain as achy, throbbing, sharp, tender, tight, with pressure and as heavy in all the areas mentioned. The pain sensations are different at different times. Pain is worse with movement, cough and deep inspiration. She also reports having had nausea with vomiting and cold sweats. Currently, pain is 10/10, sharp and heavy. She is not short of breath, nauseated or diaphoretic.       Patient is a 51 y.o. female presenting with chest pain.  Chest Pain     Past Medical History  Diagnosis Date  . Anxiety   . Spinal stenosis   . Fibromyalgia   . Restless legs syndrome   . Tobacco abuse     a. reports quitting 12/2010  . Pericarditis     a. In setting of after anterior MI 03/2011  . Sleep apnea   . Coronary artery disease     a.  03/05/2011 - Ant MI - LAD stented w/ 2.75 x 16 mm Promus  DES - course complicated by Pericarditis, re-look cath ok. 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  Proximal area stented with 2.75x40mm Promus DES  . GERD (gastroesophageal reflux disease)   . 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 Pleural effusion, left     a. Felt related to Dressler's s/p thoracentesis 03/27/11 (reactive cells only)  . Hypertension   . Left ventricular apical thrombus     a. Noted  on echo 06/07/2011.  NL EF;  b Coumadin initiated 06/2011  . High cholesterol   . Peripheral vascular disease   . Heart murmur   . CHF (congestive heart failure)   . Angina   . 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 Shortness of breath 06/16/11    "all the time"  . Umbilical hernia     unrepaired (06/16/11)  . Chronic headache 06/16/11    "every other day or so"  . Depression     Past Surgical History  Procedure Date  . Cesarean section 01/1992  . Carpal tunnel release 01/2010; 03/2010    left; right  . Coronary angioplasty with stent placement 03/05/11; 06/06/11    "1 + 1; total of 2"  . Coronary angioplasty 03/05/11; 06/06/11  . Tubal ligation 03/1992    Family History  Problem Relation Age of Onset  . Coronary artery disease Father     bilat BKA  . Coronary artery disease Sister     Posey Rea of age, but sister is younger  . Diabetes Father   . Cancer Father   . Stroke Father   . Heart attack Father   . Diabetes Mother   . Hypertension Mother     History  Substance Use Topics  . Smoking status: Former Smoker -- 1.0 packs/day for 20 years  Types: Cigarettes    Quit date: 12/04/2010  . Smokeless tobacco: Never Used  . Alcohol Use: No    OB History    Grav Para Term Preterm Abortions TAB SAB Ect Mult Living                  Review of Systems  Cardiovascular: Positive for chest pain.    Allergies  Review of patient's allergies indicates no known allergies.  Home Medications   Current Outpatient Rx  Name Route Sig Dispense Refill  . CITALOPRAM HYDROBROMIDE 10 MG PO TABS Oral Take 10 mg by mouth daily.    Marland Kitchen CLOPIDOGREL BISULFATE 75 MG PO TABS Oral Take 75 mg by mouth daily.    . CYANOCOBALAMIN 1000 MCG/ML IJ SOLN Intramuscular Inject 1,000 mcg into the muscle every 7 (seven) days. Takes on Saturday     . CYANOCOBALAMIN 500 MCG PO TABS Oral Take 500 mcg by mouth daily.      . DOC-Q-LACE PO Oral Take 100 mg by mouth as needed.    Marland Kitchen FERROUS FUM-IRON POLYSACCH 162-115.2 MG PO  CAPS Oral Take 1 capsule by mouth once a week. Take on wednesday    . GABAPENTIN 300 MG PO CAPS Oral Take 600 mg by mouth 3 (three) times daily.      Marland Kitchen HYDROCODONE-ACETAMINOPHEN 10-500 MG PO TABS Oral Take 1 tablet by mouth every 4 (four) hours as needed. For pain    . METOPROLOL TARTRATE 25 MG PO TABS Oral Take 25 mg by mouth 2 (two) times daily.    Marland Kitchen MILNACIPRAN HCL 50 MG PO TABS Oral Take 100 mg by mouth 2 (two) times daily.      Marland Kitchen BACITRACIN-NEOMYCIN-POLYMYXIN OINTMENT TUBE Topical Apply 1 application topically as needed.    Marland Kitchen NITROGLYCERIN 0.4 MG SL SUBL Sublingual Place 0.4 mg under the tongue every 5 (five) minutes as needed. For chest pain    . PANTOPRAZOLE SODIUM 40 MG PO TBEC Oral Take 40 mg by mouth daily at 6 (six) AM.    . ROPINIROLE HCL 3 MG PO TABS Oral Take 3 mg by mouth at bedtime.    Marland Kitchen SIMVASTATIN 20 MG PO TABS Oral Take 20 mg by mouth daily at 6 PM.    . TIZANIDINE HCL 4 MG PO TABS Oral Take 6 mg by mouth 3 (three) times daily. 1 and 1/2 tablet Three times daily    . VITAMIN D (ERGOCALCIFEROL) 50000 UNITS PO CAPS Oral Take 50,000 Units by mouth every 7 (seven) days. Take on wednesday    . ZOLPIDEM TARTRATE 10 MG PO TABS Oral Take 10 mg by mouth at bedtime as needed. For sleep    . ASPIRIN 325 MG PO TBEC Oral Take 1 tablet (325 mg total) by mouth 2 (two) times daily.    . WARFARIN SODIUM 2.5 MG PO TABS Oral Take 1 tablet (2.5 mg total) by mouth daily. Start taking 2.5mg  daily tomorrow 06/20/11 and have INR checked on Monday at the coumadin clinic for instructions on further dosing. 30 tablet 3    BP 116/71  Pulse 80  Temp(Src) 97.8 F (36.6 C) (Oral)  Resp 20  Ht 5\' 3"  (1.6 m)  Wt 228 lb (103.42 kg)  BMI 40.39 kg/m2  SpO2 99%  LMP 04/15/2011  ROS: Full 14 point review of systems complete and found to be negative unless listed above.   Physical Exam Blood pressure 108/56, pulse 90, temperature 98.3 F (36.8 C), temperature source Oral, resp. rate  20, last menstrual  period 04/15/2011, SpO2 100.00%.  General: Well developed, well nourished, female in no acute distress, lying supine on stretcher.  Head: Eyes PERRLA, No xanthomas. Normocephalic and atraumatic, oropharynx without edema or exudate. Dentition-poor with missing teeth.  Lungs: CTA bilaterally. Expansion equal bilaterally. Respirations equal, even and unlabored. No wheezes, rhonchi or crackles.  Heart: Chest wall very tender to palpation. Pulses are 2+ BUE, 1+ BLE. HRRR S1 S2, no rub/gallop, or murmur. Non-displaced PMI. No heaves, lifts or thrills.  Neck: Supple. No carotid bruit. No lymphadenopathy. No JVD. Trachea midline. No thyromegaly.  Abdomen: Obese, bowel sounds hypoactive, abdomen soft and non-tender without masses or hernias noted.  Msk: Tender to palpation in all joints and muscles. Baseline debility-pt uses cane to ambulate. No cva tenderness. No joint deformities or effusions.  Extremities: Mottling noted to bilat feet with cyanosis to toes of L foot. Cap refil >3sec in bilat toes. Bilat feet/toes very tender to palpation. No clubbing. No edema. Cap refil <3 sec in BUE.  Neuro: Alert and oriented X 3. No focal deficits noted.  Psych: Aggitated, wants to go home. Speaks with loud tone. Behaves Impulsive and tests boundaries. Normal timbre of speech.  Skin: No rashes or lesions noted.  ED Course  Procedures (including critical care time)  Labs Reviewed  CBC - Abnormal; Notable for the following:    RDW 16.9 (*)    All other components within normal limits  BASIC METABOLIC PANEL - Abnormal; Notable for the following:    Glucose, Bld 101 (*)    All other components within normal limits  PRO B NATRIURETIC PEPTIDE - Abnormal; Notable for the following:    Pro B Natriuretic peptide (BNP) 267.0 (*)    All other components within normal limits  PROTIME-INR - Abnormal; Notable for the following:    Prothrombin Time 16.2 (*)    All other components within normal limits  CBC - Abnormal;  Notable for the following:    Hemoglobin 11.4 (*)    RDW 17.0 (*)    All other components within normal limits  PROTIME-INR - Abnormal; Notable for the following:    Prothrombin Time 15.5 (*)    All other components within normal limits  CBC - Abnormal; Notable for the following:    Hemoglobin 11.4 (*)    RDW 16.7 (*)    All other components within normal limits  PROTIME-INR - Abnormal; Notable for the following:    Prothrombin Time 16.8 (*)    All other components within normal limits  CBC - Abnormal; Notable for the following:    Hemoglobin 11.2 (*)    HCT 35.6 (*)    RDW 16.6 (*)    All other components within normal limits  PROTIME-INR - Abnormal; Notable for the following:    Prothrombin Time 22.2 (*)    INR 1.91 (*)    All other components within normal limits  TROPONIN I  POCT I-STAT TROPONIN I  CARDIAC PANEL(CRET KIN+CKTOT+MB+TROPI)  CARDIAC PANEL(CRET KIN+CKTOT+MB+TROPI)  CARDIAC PANEL(CRET KIN+CKTOT+MB+TROPI)  HEPARIN LEVEL (UNFRACTIONATED)  HEPARIN LEVEL (UNFRACTIONATED)  HEPARIN LEVEL (UNFRACTIONATED)  HEPARIN LEVEL (UNFRACTIONATED)  LAB REPORT - SCANNED   No results found. Echo: 06/07/2011  Study Conclusions - Left ventricle: The cavity size was normal. Wall thickness was normal. The estimated ejection fraction was 55%. There is akinesis of the distal anteroseptal and apical myocardium. There is evidence of a mural thrombus noted in this region, measuring approximately 1 cm in largest dimension. This is relatively  well seen using off axis apical views. Comparison with prior echocardiographic studies finds no clear evidence of prior thrombus, although fewer views are available to review from the previous studies. Left ventricular diastolic function parameters were normal. - Mitral valve: Mild regurgitation. - Left atrium: The atrium was mildly dilated. - Tricuspid valve: Trivial regurgitation. - Pulmonary arteries: PA peak pressure: 29mm Hg (S). -  Pericardium, extracardiac: There was no pericardial effusion.  ECG: 06/16/2011  16-Jun-2011 09:28:02  Sinus tachycardia  Possible Inferior infarct , age undetermined  Cannot rule out Anterior infarct , age undetermined  Abnormal ECG  21mm/s 74mm/mV 100Hz  8.0.1 12SL 237 CID: 1  Referred by: Unconfirmed  Vent. rate 109 BPM  PR interval 152 ms  QRS duration 82 ms  QT/QTc 310/417 ms  P-R-T axes 33 40 67   Radiology:  Dg Chest Portable 1 View  06/16/2011 *RADIOLOGY REPORT* Clinical Data: Chest pain, recent stents PORTABLE CHEST - 1 VIEW Comparison: CT chest dated 06/10/2011 Findings: Lungs are essentially clear. No pleural effusion or pneumothorax. The heart is normal in size for inspiration. Coronary stent. IMPRESSION: No evidence of acute cardiopulmonary disease. Original Report Authenticated By: Lurlean Horns    1. Chest pain   2. Coronary atherosclerosis of unspecified type of vessel, native or graft   3. Coronary artery disease   4. Dressler's syndrome   5. Fibromyalgia   6. LV (left ventricular) mural thrombus       MDM  Chest Pain  Pt to be admitted by Dr. Jens Som for further evaluation of CP. Concern for cardiac etiology of Chest Pain. Cardiology has been consulted and will see patient in the ED. Pt does not meet criteria for CP protocol and a further evaluation is recommended. Pt has been re-evaluated prior to consult and VSS, NAD, heart RRR, pain 0/10, lungs CTAB. No acute abnormalities found on EKG and first round of cardiac enzymes negative. This case was discussed with Dr. Estell Harpin who has seen the patient and agrees with plan to admit.          Jaci Carrel, [place] 06/25/11 [phone]

## 2011-06-26 ENCOUNTER — Encounter: Payer: Self-pay | Admitting: Cardiology

## 2011-06-26 ENCOUNTER — Ambulatory Visit (INDEPENDENT_AMBULATORY_CARE_PROVIDER_SITE_OTHER): Payer: Self-pay | Admitting: Cardiology

## 2011-06-26 VITALS — BP 118/76 | HR 97 | Ht 63.0 in | Wt 226.0 lb

## 2011-06-26 DIAGNOSIS — D649 Anemia, unspecified: Secondary | ICD-10-CM

## 2011-06-26 DIAGNOSIS — I219 Acute myocardial infarction, unspecified: Secondary | ICD-10-CM

## 2011-06-26 DIAGNOSIS — I513 Intracardiac thrombosis, not elsewhere classified: Secondary | ICD-10-CM

## 2011-06-26 DIAGNOSIS — I241 Dressler's syndrome: Secondary | ICD-10-CM

## 2011-06-26 DIAGNOSIS — I2510 Atherosclerotic heart disease of native coronary artery without angina pectoris: Secondary | ICD-10-CM

## 2011-06-26 LAB — BASIC METABOLIC PANEL
CO2: 27 mEq/L (ref 19–32)
Chloride: 108 mEq/L (ref 96–112)
Creatinine, Ser: 0.7 mg/dL (ref 0.4–1.2)
Potassium: 4.1 mEq/L (ref 3.5–5.1)
Sodium: 142 mEq/L (ref 135–145)

## 2011-06-26 LAB — CBC WITH DIFFERENTIAL/PLATELET
Eosinophils Relative: 5.5 % — ABNORMAL HIGH (ref 0.0–5.0)
HCT: 39.8 % (ref 36.0–46.0)
Hemoglobin: 13 g/dL (ref 12.0–15.0)
Lymphs Abs: 2.1 10*3/uL (ref 0.7–4.0)
Monocytes Relative: 8.9 % (ref 3.0–12.0)
Neutro Abs: 7.2 10*3/uL (ref 1.4–7.7)
Platelets: 384 10*3/uL (ref 150.0–400.0)
WBC: 10.9 10*3/uL — ABNORMAL HIGH (ref 4.5–10.5)

## 2011-06-26 NOTE — Patient Instructions (Signed)
Your physician recommends that you schedule a follow-up appointment in: 2 WEEKS  Your physician recommends that you have lab work today: BMP and CBC  Your physician recommends that you continue on your current medications as directed. Please refer to the Current Medication list given to you today.

## 2011-06-26 NOTE — Assessment & Plan Note (Signed)
Would prefer treatment along the lines of WOEST.  However, she has MS chest pain as well, and these get confused.  Will see again in two weeks, and if stable, will stop ASA, and only use combo therapy.  Plan is for warfarin for three months only, with INR rate of 1.7-2.2.  Warned patient against activities that cause bleeding.

## 2011-06-26 NOTE — Assessment & Plan Note (Signed)
Symptoms and findings largely resolved.

## 2011-06-26 NOTE — Assessment & Plan Note (Signed)
On warfarin for three months.

## 2011-06-26 NOTE — Assessment & Plan Note (Signed)
Recheck CBC.  Early follow up to watch for bleeding.

## 2011-06-26 NOTE — Progress Notes (Signed)
HPI:  She is doing well.  Denies any chest pain this time.  We reveiwed in great detail the medications she is on, including ASA, clopidogrel, and also warfarin.  She is not having chest pain, and not doing much.  She is not having problems.    Current Outpatient Prescriptions  Medication Sig Dispense Refill  . aspirin EC 325 MG EC tablet Take 1 tablet (325 mg total) by mouth 2 (two) times daily.      . citalopram (CELEXA) 10 MG tablet Take 10 mg by mouth daily.      . clopidogrel (PLAVIX) 75 MG tablet Take 75 mg by mouth daily.      . cyanocobalamin (,VITAMIN B-12,) 1000 MCG/ML injection Inject 1,000 mcg into the muscle every 7 (seven) days. Takes on Saturday       . cyanocobalamin 500 MCG tablet Take 500 mcg by mouth daily.        [name] Sodium (DOC-Q-LACE PO) Take 100 mg by mouth as needed.      . ferrous fumarate-iron polysaccharide complex (TANDEM) 162-115.2 MG CAPS Take 1 capsule by mouth once a week. Take on wednesday      . gabapentin (NEURONTIN) 300 MG capsule Take 600 mg by mouth 3 (three) times daily.        Marland Kitchen HYDROcodone-acetaminophen (LORTAB) 10-500 MG per tablet Take 1 tablet by mouth every 4 (four) hours as needed. For pain      . metoprolol tartrate (LOPRESSOR) 25 MG tablet Take 25 mg by mouth 2 (two) times daily.      . Milnacipran (SAVELLA) 50 MG TABS Take 100 mg by mouth 2 (two) times daily.        Marland Kitchen neomycin-bacitracin-polymyxin (NEOSPORIN) OINT Apply 1 application topically as needed.      . nitroGLYCERIN (NITROSTAT) 0.4 MG SL tablet Place 0.4 mg under the tongue every 5 (five) minutes as needed. For chest pain      . pantoprazole (PROTONIX) 40 MG tablet Take 40 mg by mouth daily at 6 (six) AM.      . rOPINIRole (REQUIP) 3 MG tablet Take 3 mg by mouth at bedtime.      . simvastatin (ZOCOR) 20 MG tablet Take 20 mg by mouth daily at 6 PM.      . tiZANidine (ZANAFLEX) 4 MG tablet Take 6 mg by mouth 3 (three) times daily. 1 and 1/2 tablet Three times daily      . Vitamin  D, Ergocalciferol, (DRISDOL) 50000 UNITS CAPS Take 50,000 Units by mouth every 7 (seven) days. Take on wednesday      . warfarin (COUMADIN) 2.5 MG tablet Take 1 tablet (2.5 mg total) by mouth daily. Start taking 2.5mg  daily tomorrow 06/20/11 and have INR checked on Monday at the coumadin clinic for instructions on further dosing.  30 tablet  3  . zolpidem (AMBIEN) 10 MG tablet Take 10 mg by mouth at bedtime as needed. For sleep        No Known Allergies  Past Medical History  Diagnosis Date  . Anxiety   . Spinal stenosis   . Fibromyalgia   . Restless legs syndrome   . Tobacco abuse     a. reports quitting 12/2010  . Pericarditis     a. In setting of after anterior MI 03/2011  . Sleep apnea   . Coronary artery disease     a.  03/05/2011 - Ant MI - LAD stented w/ 2.75 x 16 mm Promus  DES - course complicated by Pericarditis, re-look cath ok. 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  Proximal area stented with 2.75x64mm Promus DES  . GERD (gastroesophageal reflux disease)   . 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 Pleural effusion, left     a. Felt related to Dressler's s/p thoracentesis 03/27/11 (reactive cells only)  . Hypertension   . Left ventricular apical thrombus     a. Noted on echo 06/07/2011.  NL EF;  b Coumadin initiated 06/2011  . High cholesterol   . Peripheral vascular disease   . Heart murmur   . CHF (congestive heart failure)   . Angina   . 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 Shortness of breath 06/16/11    "all the time"  . Umbilical hernia     unrepaired (06/16/11)  . Chronic headache 06/16/11    "every other day or so"  . Depression     Past Surgical History  Procedure Date  . Cesarean section 01/1992  . Carpal tunnel release 01/2010; 03/2010    left; right  . Coronary angioplasty with stent placement 03/05/11; 06/06/11    "1 + 1; total of 2"  . Coronary angioplasty 03/05/11; 06/06/11  . Tubal ligation 03/1992      Family History  Problem Relation Age of Onset  . Coronary artery disease Father     bilat BKA  . Coronary artery disease Sister     Posey Rea of age, but sister is younger  . Diabetes Father   . Cancer Father   . Stroke Father   . Heart attack Father   . Diabetes Mother   . Hypertension Mother     History   Social History  . Marital Status: Married    Spouse Name: N/A    Number of Children: N/A  . Years of Education: N/A   Occupational History  . unemployed    Social History Main Topics  . Smoking status: Former Smoker -- 1.0 packs/day for 20 years    Types: Cigarettes    Quit date: 12/04/2010  . Smokeless tobacco: Never Used  . Alcohol Use: No  . Drug Use: No  . Sexually Active: Not Currently   Other Topics Concern  . Not on file   Social History Narrative   [place] Married. Doesn't currently work and has applied for financial assistance programs. Lives in private residence with husband and daughter. Has 4 sisters and one brother.  All siblings are alive, only one is well. Oldest sister has a heart murmur. Second to oldest sister has anxiety/depression and HTN. Middle sister has ?CA, HTN and depression. Younger sister has stomach cancer, DM, anxiety/depression and HTN.    ROS: Please see the HPI.  All other systems reviewed and negative.  PHYSICAL EXAM:  BP 118/76  Pulse 97  Ht 5\' 3"  (1.6 m)  Wt 226 lb (102.513 kg)  BMI 40.03 kg/m2  LMP 04/15/2011  General: Well developed, well nourished, in no acute distress. Head:  Normocephalic and atraumatic. Neck: no JVD Lungs: Clear to auscultation and percussion.  No pleural or pericardial rub.   Heart: Normal S1 and S2.  No murmur, rubs or gallops.  Abdomen:  Normal bowel sounds; soft; non tender; no organomegaly Pulses: Pulses normal in all 4 extremities. Extremities: No clubbing or cyanosis. No edema. Neurologic: Alert and oriented x 3.  EKG:  NSR.  Cannot rule out anterior  MI.   Anterolateral ST and T changes---these are chronic.   ASSESSMENT AND PLAN:

## 2011-07-01 ENCOUNTER — Ambulatory Visit (INDEPENDENT_AMBULATORY_CARE_PROVIDER_SITE_OTHER): Payer: Self-pay | Admitting: *Deleted

## 2011-07-01 DIAGNOSIS — I219 Acute myocardial infarction, unspecified: Secondary | ICD-10-CM

## 2011-07-01 DIAGNOSIS — Z7901 Long term (current) use of anticoagulants: Secondary | ICD-10-CM

## 2011-07-01 DIAGNOSIS — I513 Intracardiac thrombosis, not elsewhere classified: Secondary | ICD-10-CM

## 2011-07-01 LAB — POCT INR: INR: 1.2

## 2011-07-01 NOTE — ED Provider Notes (Signed)
Medical screening examination/treatment/procedure(s) were performed by non-physician practitioner and as supervising physician I was immediately available for consultation/collaboration.   Diamantina Edinger L Rayan Dyal, MD 07/01/11 1239 

## 2011-07-08 ENCOUNTER — Ambulatory Visit (INDEPENDENT_AMBULATORY_CARE_PROVIDER_SITE_OTHER): Payer: Self-pay | Admitting: *Deleted

## 2011-07-08 DIAGNOSIS — Z7901 Long term (current) use of anticoagulants: Secondary | ICD-10-CM

## 2011-07-08 DIAGNOSIS — I219 Acute myocardial infarction, unspecified: Secondary | ICD-10-CM

## 2011-07-08 DIAGNOSIS — I513 Intracardiac thrombosis, not elsewhere classified: Secondary | ICD-10-CM

## 2011-07-09 ENCOUNTER — Ambulatory Visit: Payer: Self-pay | Admitting: Cardiology

## 2011-07-09 NOTE — Discharge Summary (Signed)
Coronary artery disease (06/05/2011)/LV mural thrombus Assessment: stable Plan: will continue on plavix/warfarin  Keep warfarin low. She will take ASA until next week (for MS pain).  Needs coumadin clinic visit.  INR is 1.9 today--went out quickly last time so will hold warfarin today. Would dc on 2.5 mg and have her seen in our coumadin clinic next week.  Greater than 30 minute dc time. Need follow up with me next week.

## 2011-07-09 NOTE — Discharge Summary (Signed)
Patient seen per note.  Plan discharge today with early follow up.

## 2011-07-11 ENCOUNTER — Ambulatory Visit (INDEPENDENT_AMBULATORY_CARE_PROVIDER_SITE_OTHER): Payer: Self-pay | Admitting: *Deleted

## 2011-07-11 DIAGNOSIS — I513 Intracardiac thrombosis, not elsewhere classified: Secondary | ICD-10-CM

## 2011-07-11 DIAGNOSIS — Z7901 Long term (current) use of anticoagulants: Secondary | ICD-10-CM

## 2011-07-11 DIAGNOSIS — I219 Acute myocardial infarction, unspecified: Secondary | ICD-10-CM

## 2011-07-13 ENCOUNTER — Encounter: Payer: Self-pay | Admitting: Cardiology

## 2011-07-13 DIAGNOSIS — R079 Chest pain, unspecified: Secondary | ICD-10-CM

## 2011-07-14 ENCOUNTER — Encounter: Payer: Self-pay | Admitting: Cardiology

## 2011-07-14 DIAGNOSIS — R072 Precordial pain: Secondary | ICD-10-CM

## 2011-07-15 ENCOUNTER — Ambulatory Visit (INDEPENDENT_AMBULATORY_CARE_PROVIDER_SITE_OTHER): Payer: Self-pay | Admitting: *Deleted

## 2011-07-15 DIAGNOSIS — I2510 Atherosclerotic heart disease of native coronary artery without angina pectoris: Secondary | ICD-10-CM

## 2011-07-15 DIAGNOSIS — Z7901 Long term (current) use of anticoagulants: Secondary | ICD-10-CM

## 2011-07-15 DIAGNOSIS — I219 Acute myocardial infarction, unspecified: Secondary | ICD-10-CM

## 2011-07-15 DIAGNOSIS — I513 Intracardiac thrombosis, not elsewhere classified: Secondary | ICD-10-CM

## 2011-07-15 LAB — POCT INR: INR: 4.2

## 2011-07-18 ENCOUNTER — Encounter: Payer: Self-pay | Admitting: Cardiology

## 2011-07-18 ENCOUNTER — Ambulatory Visit (INDEPENDENT_AMBULATORY_CARE_PROVIDER_SITE_OTHER): Payer: Self-pay | Admitting: Cardiology

## 2011-07-18 VITALS — BP 100/70 | HR 94 | Ht 63.0 in | Wt 224.0 lb

## 2011-07-18 DIAGNOSIS — I513 Intracardiac thrombosis, not elsewhere classified: Secondary | ICD-10-CM

## 2011-07-18 DIAGNOSIS — E782 Mixed hyperlipidemia: Secondary | ICD-10-CM

## 2011-07-18 DIAGNOSIS — J181 Lobar pneumonia, unspecified organism: Secondary | ICD-10-CM

## 2011-07-18 DIAGNOSIS — I241 Dressler's syndrome: Secondary | ICD-10-CM

## 2011-07-18 DIAGNOSIS — R079 Chest pain, unspecified: Secondary | ICD-10-CM

## 2011-07-18 DIAGNOSIS — I309 Acute pericarditis, unspecified: Secondary | ICD-10-CM

## 2011-07-18 DIAGNOSIS — I2510 Atherosclerotic heart disease of native coronary artery without angina pectoris: Secondary | ICD-10-CM

## 2011-07-18 DIAGNOSIS — J13 Pneumonia due to Streptococcus pneumoniae: Secondary | ICD-10-CM

## 2011-07-18 DIAGNOSIS — I219 Acute myocardial infarction, unspecified: Secondary | ICD-10-CM

## 2011-07-18 NOTE — Assessment & Plan Note (Addendum)
I doubt that she really had this.  I covered this in detail with the patient and her daughter.  Specifically, she did have pleuritic type pain.  However, her CRP and ESR were virtually normal.  Previously, she had a pleural effusion with inflammatory markers in the fluid after thoracentesis consistent with the initial diagnosis.  I showed her the CXR now and then.  She does have some pleuritic pain by description, and atypical chest pain.  Her cardiac markers are also normal.   Colchicine was prescribed, but she cannot affort anyway as it is more than $300.  At present, I would favor continuing to monitor her.  Her enzymes, ESR, and CRP were all normal. She is closely in on more than two months since the second stent implantation, so the risk of stent thrombosis should remain low. Moreover, she is NOT a clopidogrel hypo-responder  (see PRU data), so the statistical likelihood of after thirty days, and responder of having ST should be fairly low as long as she maintains medical compliance.  I reviewed this with her and her daughter in detail.  I will continue to monitor closely.

## 2011-07-18 NOTE — Patient Instructions (Addendum)
Your physician recommends that you schedule a follow-up appointment in: 1 MONTH  Your physician has recommended you make the following change in your medication: STOP Coumadin, Decrease Aspirin to 81mg  daily, Continue Plavix

## 2011-07-18 NOTE — Progress Notes (Signed)
HPI:  Kristy Sloan returns for follow up.  She developed some chest pain which she describes as sudden, worse with breathing in.  She was admitted [facility]. Her pain was sharp, sudden and seemingly worse with inspiration. 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 She was given colchicine. However, her CRP and sedimentation rate were both normal. Her cardiac enzymes were negative. At the time of her Dressler she's been out of the hospital for the past 3 days, and her initial INR was high. Her Coumadin has been sexually adjusted. syndrome, the patient had marked left pleural effusion, and aspiration this revealed inflammatory fluid. Her current chest x-ray is without significant pleural effusion. A repeat echocardiogram was performed, and this was read by Dr. Myrtis Ser. No left ventricular mural thrombus was identified. She has gained a few pounds, and short of breath with exertion. Other laboratory studies included a normal white count. She was reported to have a fever, there was no documented fever on her charts. Her oxygen saturation at that time was normal.    Current Outpatient Prescriptions  Medication Sig Dispense Refill  . aspirin EC 325 MG EC tablet Take 1 tablet (325 mg total) by mouth 2 (two) times daily.      . citalopram (CELEXA) 10 MG tablet Take 10 mg by mouth daily.      . clopidogrel (PLAVIX) 75 MG tablet Take 75 mg by mouth daily.      . cyanocobalamin (,VITAMIN B-12,) 1000 MCG/ML injection Inject 1,000 mcg into the muscle every 7 (seven) days. Takes on Saturday       . cyanocobalamin 500 MCG tablet Take 500 mcg by mouth daily.        [name] Sodium (DOC-Q-LACE PO) Take 100 mg by mouth as needed.      . ferrous fumarate-iron polysaccharide complex (TANDEM) 162-115.2 MG CAPS Take 1 capsule by mouth once a week. Take on wednesday      . gabapentin (NEURONTIN) 300 MG capsule Take 600 mg by mouth 3 (three) times daily.        Marland Kitchen  HYDROcodone-acetaminophen (LORTAB) 10-500 MG per tablet Take 1 tablet by mouth every 4 (four) hours as needed. For pain      . metoprolol tartrate (LOPRESSOR) 25 MG tablet Take 25 mg by mouth 2 (two) times daily.      . Milnacipran (SAVELLA) 50 MG TABS Take 100 mg by mouth 2 (two) times daily.        Marland Kitchen neomycin-bacitracin-polymyxin (NEOSPORIN) OINT Apply 1 application topically as needed.      . nitroGLYCERIN (NITROSTAT) 0.4 MG SL tablet Place 0.4 mg under the tongue every 5 (five) minutes as needed. For chest pain      . pantoprazole (PROTONIX) 40 MG tablet Take 40 mg by mouth daily at 6 (six) AM.      . rOPINIRole (REQUIP) 3 MG tablet Take 3 mg by mouth at bedtime.      . simvastatin (ZOCOR) 20 MG tablet Take 20 mg by mouth daily at 6 PM.      . tiZANidine (ZANAFLEX) 4 MG tablet Take 6 mg by mouth 3 (three) times daily. 1 and 1/2 tablet Three times daily      . Vitamin D, Ergocalciferol, (DRISDOL) 50000 UNITS CAPS Take 50,000 Units by mouth every 7 (seven) days. Take on wednesday      . warfarin (COUMADIN) 2.5 MG tablet Take 1 tablet (2.5 mg total) by mouth daily. Start taking 2.5mg   daily tomorrow 06/20/11 and have INR checked on Monday at the coumadin clinic for instructions on further dosing.  30 tablet  3  . zolpidem (AMBIEN) 10 MG tablet Take 10 mg by mouth at bedtime as needed. For sleep        No Known Allergies  Past Medical History  Diagnosis Date  . Anxiety   . Spinal stenosis   . Fibromyalgia   . Restless legs syndrome   . Tobacco abuse     a. reports quitting 12/2010  . Pericarditis     a. In setting of after anterior MI 03/2011  . Sleep apnea   . Coronary artery disease     a.  03/05/2011 - Ant MI - LAD stented w/ 2.75 x 16 mm Promus  DES - course complicated by Pericarditis, re-look cath ok. 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  Proximal area stented with 2.75x47mm Promus DES  . GERD (gastroesophageal reflux disease)     . 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 Pleural effusion, left     a. Felt related to Dressler's s/p thoracentesis 03/27/11 (reactive cells only)  . Hypertension   . Left ventricular apical thrombus     a. Noted on echo 06/07/2011.  NL EF;  b Coumadin initiated 06/2011  . High cholesterol   . Peripheral vascular disease   . Heart murmur   . CHF (congestive heart failure)   . Angina   . 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 Shortness of breath 06/16/11    "all the time"  . Umbilical hernia     unrepaired (06/16/11)  . Chronic headache 06/16/11    "every other day or so"  . Depression     Past Surgical History  Procedure Date  . Cesarean section 01/1992  . Carpal tunnel release 01/2010; 03/2010    left; right  . Coronary angioplasty with stent placement 03/05/11; 06/06/11    "1 + 1; total of 2"  . Coronary angioplasty 03/05/11; 06/06/11  . Tubal ligation 03/1992    Family History  Problem Relation Age of Onset  . Coronary artery disease Father     bilat BKA  . Coronary artery disease Sister     Posey Rea of age, but sister is younger  . Diabetes Father   . Cancer Father   . Stroke Father   . Heart attack Father   . Diabetes Mother   . Hypertension Mother     History   Social History  . Marital Status: Married    Spouse Name: N/A    Number of Children: N/A  . Years of Education: N/A   Occupational History  . unemployed    Social History Main Topics  . Smoking status: Former Smoker -- 1.0 packs/day for 20 years    Types: Cigarettes    Quit date: 12/04/2010  . Smokeless tobacco: Never Used  . Alcohol Use: No  . Drug Use: No  . Sexually Active: Not Currently   Other Topics Concern  . Not on file   Social History Narrative   [place] Married. Doesn't currently work and has applied for financial assistance programs. Lives in private residence with husband and daughter. Has 4 sisters and one brother.  All siblings are alive, only one is  well. Oldest sister has a heart murmur. Second to oldest sister has anxiety/depression and HTN. Middle sister has ?CA, HTN and depression.  Younger sister has stomach cancer, DM, anxiety/depression and HTN.    ROS: Please see the HPI.  All other systems reviewed and negative.  Her nausea and vomiting have resolved.   PHYSICAL EXAM:  BP 100/70  Pulse 94  Ht 5\' 3"  (1.6 m)  Wt 224 lb (101.606 kg)  BMI 39.68 kg/m2  SpO2 98%  General: Well developed, well nourished, in no acute distress. Head:  Normocephalic and atraumatic. Neck: no JVD Lungs: Clear to auscultation and percussion.  No pleural rub Heart: Normal S1 and S2.  No murmur, rubs or gallops.  Abdomen:  Normal bowel sounds; soft; non tender; no organomegaly Pulses: Pulses normal in all 4 extremities. Extremities: No clubbing or cyanosis. No edema. Neurologic: Alert and oriented x 3.  EKG:  NSR.  Nonspecific ST and T wave changes.    ASSESSMENT AND PLAN:  More than one hour of evaluation time.

## 2011-07-19 NOTE — Assessment & Plan Note (Signed)
Resolved.  See latest film in EPIC.

## 2011-07-19 NOTE — Assessment & Plan Note (Signed)
She has had six weeks or more of therapy, and see latest echo.  Given issues, favor dc of warfarin in lieu of continuation of DAPT.

## 2011-07-19 NOTE — Assessment & Plan Note (Signed)
Will need follow up values as op.

## 2011-07-19 NOTE — Assessment & Plan Note (Signed)
See my note under Dresslers.  Importantly, I will recommend that we reduce ASA to 81 mg in combination with her standard dose of clopidogrel.  I will also recommend that we stop warfarin at this point in time.  Repeat echo does not demonstrate or suggest apical murmur thrombus at this point--see Myrtis Ser note.  I think the continuation of this complicated regimen does no longer justify the risk  (we had hoped for warfarin and clopidogrel only via WOEST) but had continued ASA as a treatment modality for her pleuritic type post Dresslers symptoms in absence of use of NSAIDS or steroids, the latter specifically related to her apical completed infarct.  But  I think the simplest thing at this point, and safest thing, is to default to the standard DAPT strategy of stent implanation and get out one year on this.   I would evaluate symptoms, but tried to avoid overtreatment in the absence of objective data as I think this poses a risk in [place].  She has some chronic pain issues with fibromyalgia that make accurate assessment difficult at times.  However, the most recent hospitalization is quite instructive in her care.

## 2011-07-19 NOTE — Assessment & Plan Note (Signed)
Would be reluctant to call it this--see sed rate, CRP, echo, ECG

## 2011-07-21 ENCOUNTER — Ambulatory Visit: Payer: Self-pay | Admitting: *Deleted

## 2011-07-21 DIAGNOSIS — I513 Intracardiac thrombosis, not elsewhere classified: Secondary | ICD-10-CM

## 2011-07-21 DIAGNOSIS — Z7901 Long term (current) use of anticoagulants: Secondary | ICD-10-CM

## 2011-07-25 ENCOUNTER — Encounter (HOSPITAL_COMMUNITY): Payer: Self-pay | Admitting: *Deleted

## 2011-08-05 NOTE — Progress Notes (Signed)
Addended by: Reine Just on: 08/05/2011 07:34 PM   Modules accepted: Orders

## 2011-08-22 ENCOUNTER — Encounter: Payer: Self-pay | Admitting: Cardiology

## 2011-08-25 ENCOUNTER — Encounter: Payer: Self-pay | Admitting: Cardiology

## 2011-08-25 ENCOUNTER — Ambulatory Visit: Payer: Self-pay | Admitting: Cardiology

## 2011-08-26 ENCOUNTER — Encounter: Payer: Self-pay | Admitting: Cardiology

## 2011-09-15 ENCOUNTER — Ambulatory Visit: Payer: Self-pay | Admitting: Cardiology

## 2011-12-22 DIAGNOSIS — R079 Chest pain, unspecified: Secondary | ICD-10-CM

## 2012-01-06 ENCOUNTER — Ambulatory Visit: Payer: Self-pay | Admitting: Cardiology

## 2012-02-04 ENCOUNTER — Ambulatory Visit: Payer: Self-pay | Admitting: Cardiology

## 2012-04-13 ENCOUNTER — Other Ambulatory Visit: Payer: Self-pay | Admitting: *Deleted

## 2012-08-15 DIAGNOSIS — R079 Chest pain, unspecified: Secondary | ICD-10-CM

## 2012-11-04 ENCOUNTER — Encounter: Payer: Self-pay | Admitting: Physician Assistant

## 2012-11-17 ENCOUNTER — Encounter: Payer: Self-pay | Admitting: Physician Assistant

## 2013-05-27 IMAGING — CR DG CHEST 1V PORT
1 series · 1 of 1 positions shown · non-contrast
Comparison: 05/03/2011; 04/11/2011; 03/29/2011; chest CT -
03/25/2011

CLINICAL DATA: Chest pain (preop cardiac catheterization)

PORTABLE CHEST - 1 VIEW

[view not recorded]
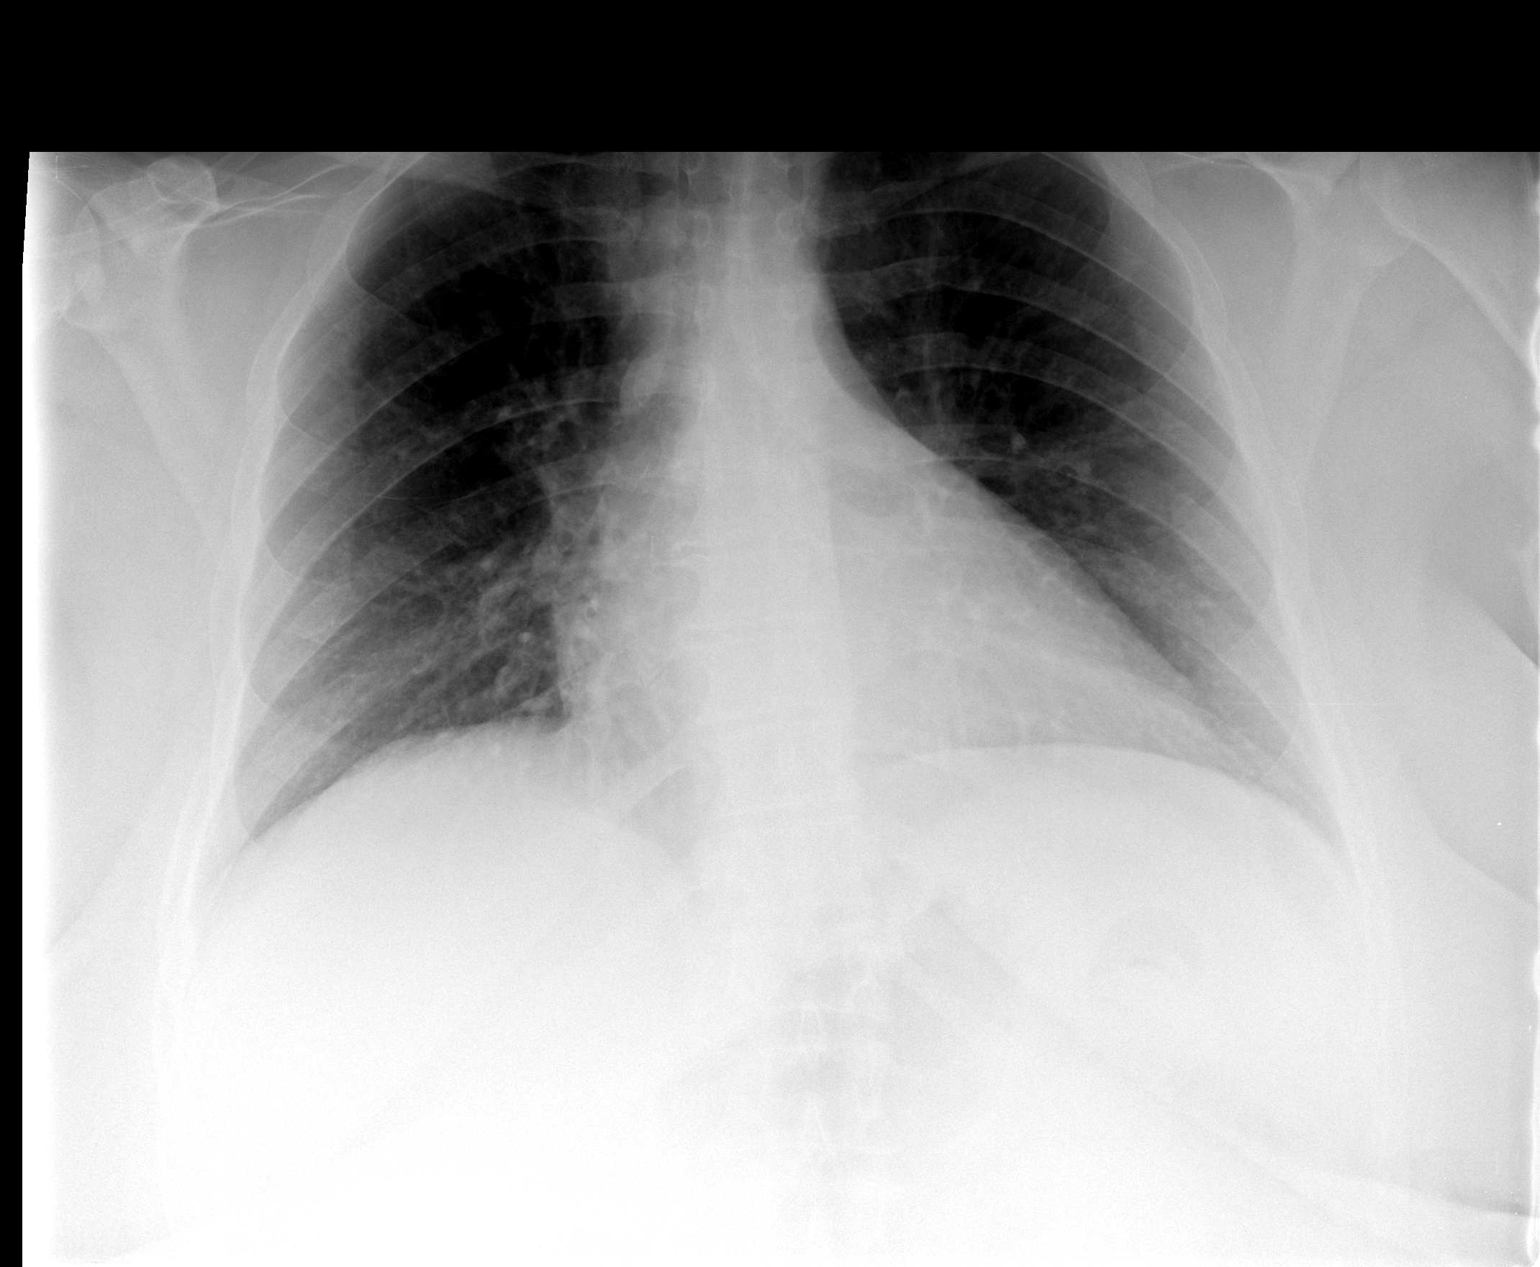

[1 of 1 positions shown; findings below may reference images not displayed]

FINDINGS: Grossly unchanged enlarged cardiac silhouette.  Lung
volumes remain persistently reduced with grossly unchanged
bibasilar opacities.  No definite evidence of pulmonary edema.  No
definite pleural effusion or pneumothorax.  Grossly unchanged
bones.
IMPRESSION: 1.  No acute cardiopulmonary disease.
2.  Persistently reduced lung volumes with bibasilar opacities,
likely atelectasis.

## 2013-05-31 IMAGING — CT CT ANGIO CHEST
2 of 7 series · 18 of 36 positions shown · IV contrast (CONTRAST)
Comparison: Chest CT 02/22/2012.

CLINICAL DATA: Chest pain.  Short of breath.  Dizziness.

CT ANGIOGRAPHY CHEST
TECHNIQUE: Multidetector CT imaging of the chest using the
standard protocol during bolus administration of intravenous
contrast. Multiplanar reconstructed images including MIPs were
obtained and reviewed to evaluate the vascular anatomy.
Contrast: 80mL OMNIPAQUE IOHEXOL 350 MG/ML SOLN

[Series 5: pe thins · axial · 0.68mm/px · z∈[-217,-20]mm · 17 of 223 slices shown]
[im 13/223  lung]
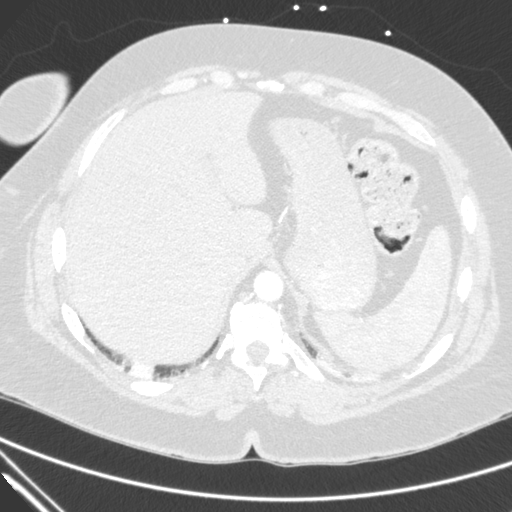
[im 25/223  mediastinal]
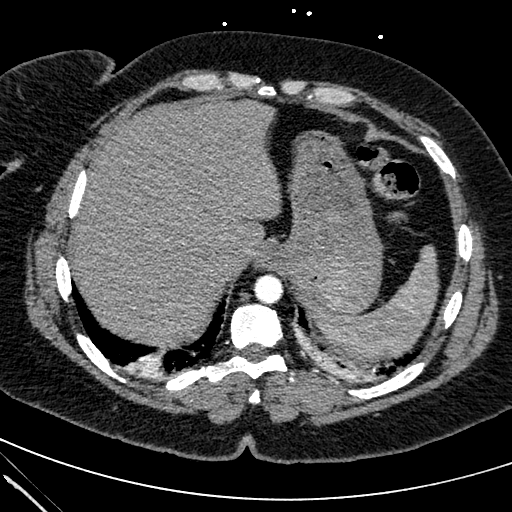
[im 38/223  lung]
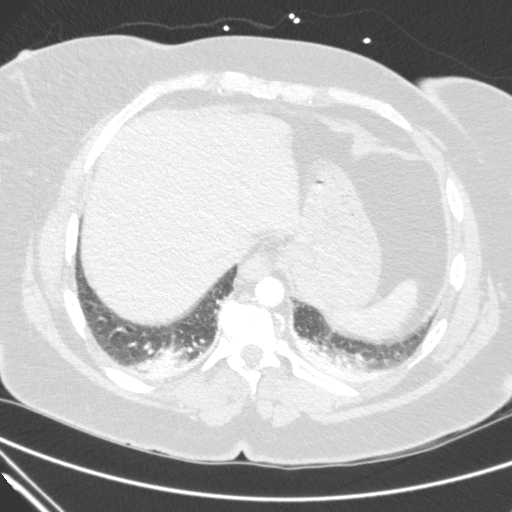
[im 50/223  mediastinal]
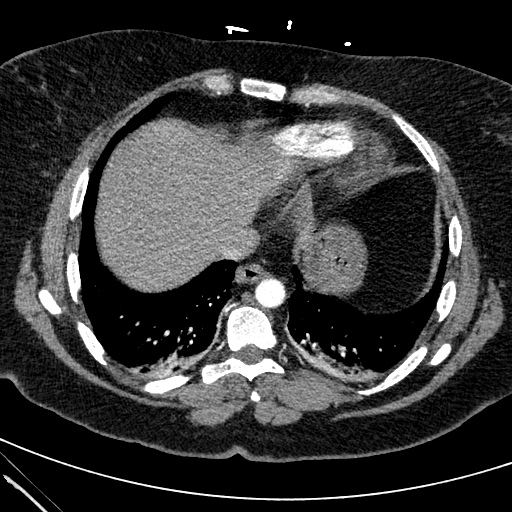
[im 62/223  lung]
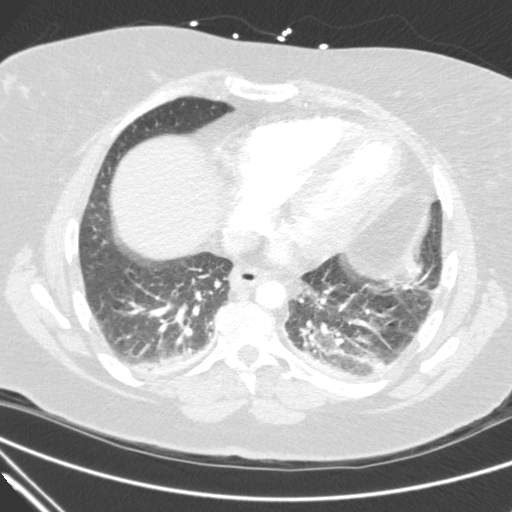
[im 75/223  mediastinal]
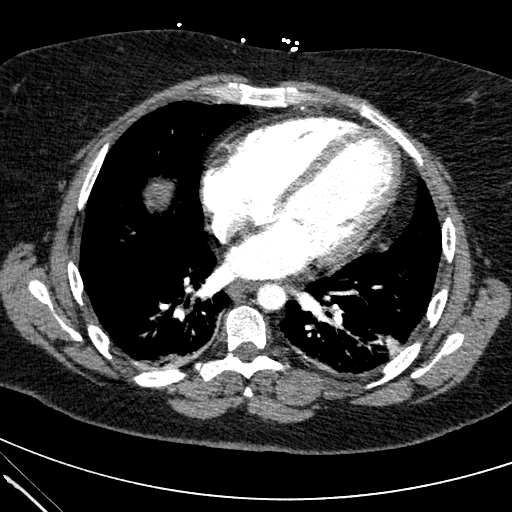
[im 87/223  lung]
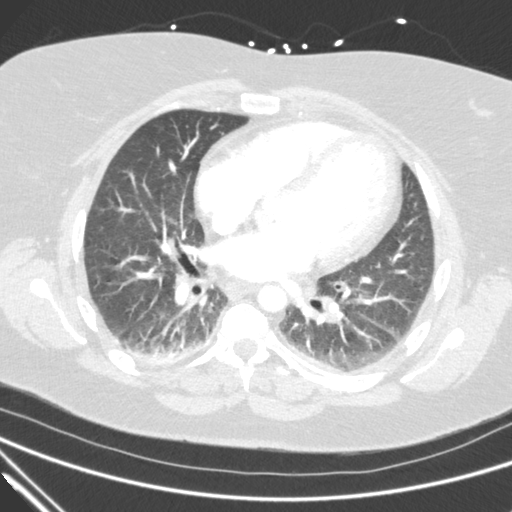
[im 99/223  mediastinal]
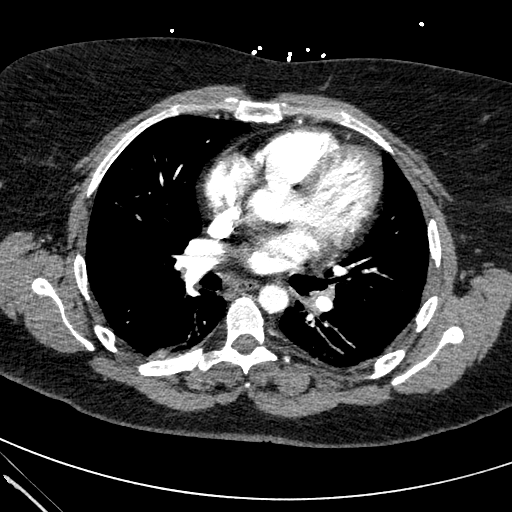
[im 112/223  lung]
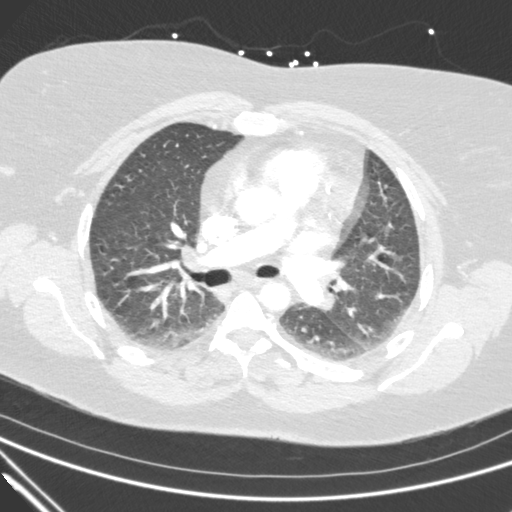
[im 124/223  mediastinal]
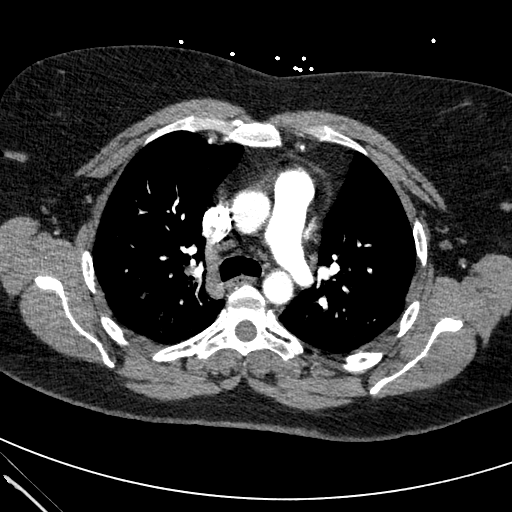
[im 136/223  lung]
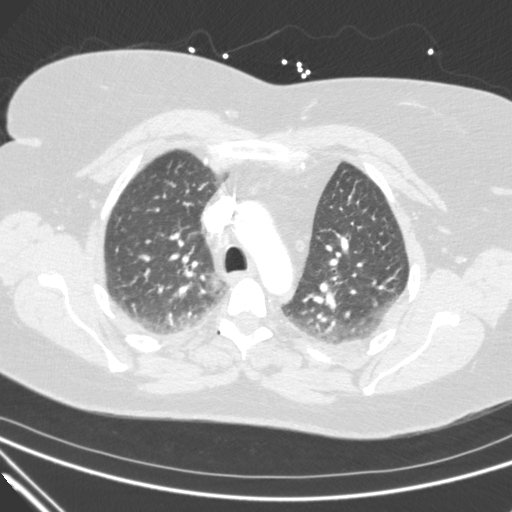
[im 149/223  mediastinal]
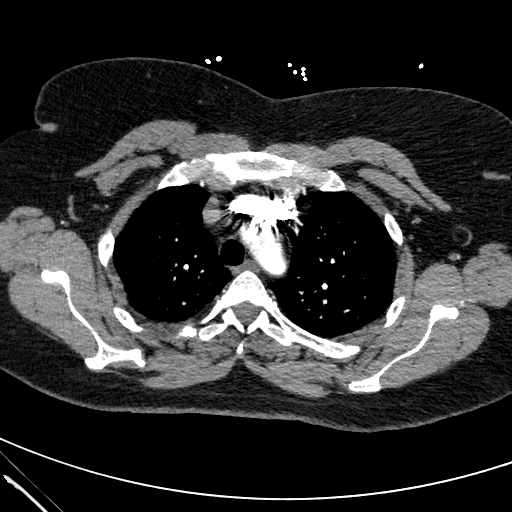
[im 161/223  lung]
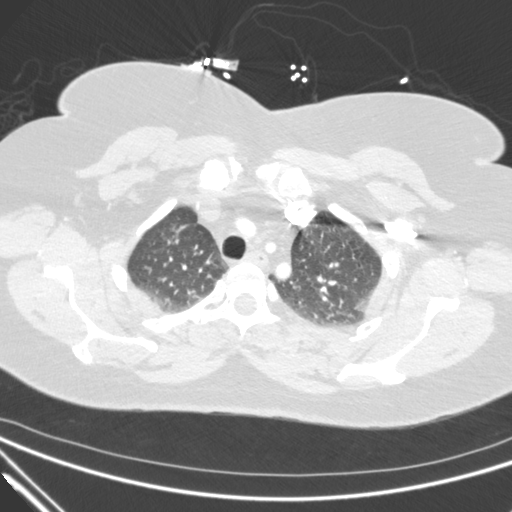
[im 173/223  mediastinal]
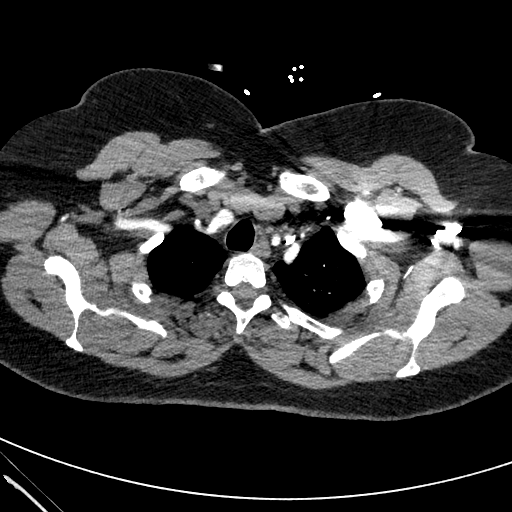
[im 186/223  lung]
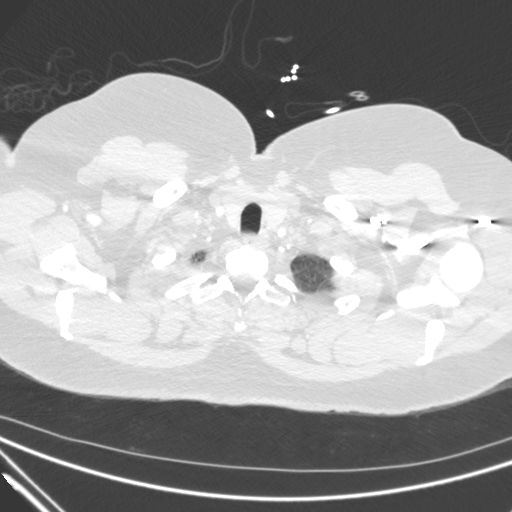
[im 198/223  mediastinal]
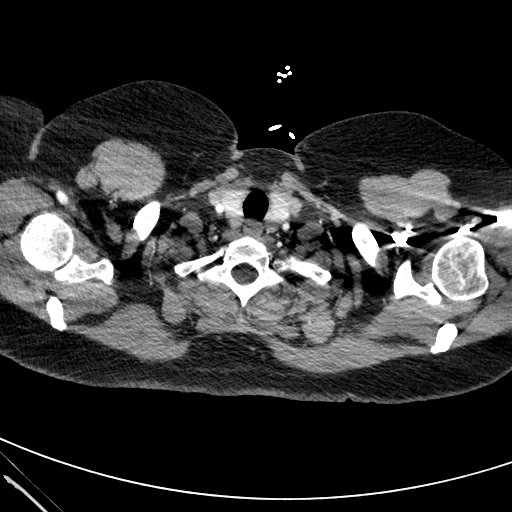
[im 210/223  lung]
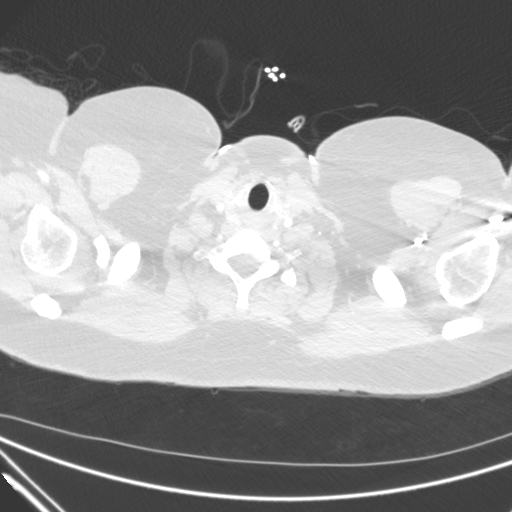

[Series 8058: cor · coronal · 0.68mm/px · 1 of 102 slices shown]
[im 51/102  mediastinal]
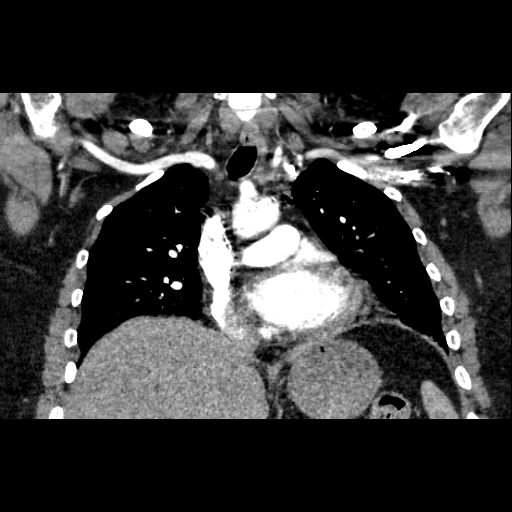

[18 of 36 positions shown; findings below may reference images not displayed]

FINDINGS: The study is technically adequate for evaluation of
pulmonary embolus.  No pulmonary embolism is present.  Significant
bilateral lower lobe dependent atelectasis is present.  There is no
effusion.  Incidental imaging of the upper abdomen is within normal
limits.  LAD coronary artery stent is present. Heart is mildly
enlarged.

No aggressive osseous lesions are identified.  No airspace
disease/pneumonia.  Small sclerotic lesion is present in the right
glenoid compatible with a bone island.  Three-vessel aortic arch.
No acute aortic abnormality.
IMPRESSION: 1.  Technically adequate study without pulmonary embolus.
2. Coronary artery stent..
3.  Mild cardiomegaly.
4.  Basilar atelectasis.

## 2014-02-09 ENCOUNTER — Encounter (HOSPITAL_COMMUNITY): Payer: Self-pay | Admitting: Cardiology

## 2015-03-31 ENCOUNTER — Observation Stay (HOSPITAL_COMMUNITY)
Admission: EM | Admit: 2015-03-31 | Discharge: 2015-04-01 | Disposition: A | Discharge status: Home / Self Care | Payer: Medicaid - Out of State | Source: Ambulatory Visit | Attending: Cardiovascular Disease | Admitting: Cardiovascular Disease

## 2015-03-31 ENCOUNTER — Encounter (HOSPITAL_COMMUNITY)
Admission: EM | Disposition: A | Discharge status: Home / Self Care | Payer: Self-pay | Source: Ambulatory Visit | Attending: Cardiovascular Disease

## 2015-03-31 ENCOUNTER — Encounter (HOSPITAL_COMMUNITY): Payer: Self-pay | Admitting: *Deleted

## 2015-03-31 DIAGNOSIS — Z8249 Family history of ischemic heart disease and other diseases of the circulatory system: Secondary | ICD-10-CM | POA: Diagnosis not present

## 2015-03-31 DIAGNOSIS — I25110 Atherosclerotic heart disease of native coronary artery with unstable angina pectoris: Secondary | ICD-10-CM | POA: Diagnosis not present

## 2015-03-31 DIAGNOSIS — R079 Chest pain, unspecified: Secondary | ICD-10-CM

## 2015-03-31 DIAGNOSIS — I252 Old myocardial infarction: Secondary | ICD-10-CM | POA: Diagnosis not present

## 2015-03-31 DIAGNOSIS — Z720 Tobacco use: Secondary | ICD-10-CM | POA: Diagnosis present

## 2015-03-31 DIAGNOSIS — E785 Hyperlipidemia, unspecified: Secondary | ICD-10-CM | POA: Diagnosis not present

## 2015-03-31 DIAGNOSIS — I10 Essential (primary) hypertension: Secondary | ICD-10-CM | POA: Insufficient documentation

## 2015-03-31 DIAGNOSIS — Z87891 Personal history of nicotine dependence: Secondary | ICD-10-CM | POA: Diagnosis not present

## 2015-03-31 DIAGNOSIS — I2582 Chronic total occlusion of coronary artery: Secondary | ICD-10-CM | POA: Diagnosis not present

## 2015-03-31 DIAGNOSIS — I213 ST elevation (STEMI) myocardial infarction of unspecified site: Secondary | ICD-10-CM

## 2015-03-31 DIAGNOSIS — I2510 Atherosclerotic heart disease of native coronary artery without angina pectoris: Secondary | ICD-10-CM | POA: Diagnosis present

## 2015-03-31 HISTORY — PX: CARDIAC CATHETERIZATION: SHX172

## 2015-03-31 LAB — TROPONIN I

## 2015-03-31 LAB — CREATININE, SERUM
Creatinine, Ser: 1.13 mg/dL — ABNORMAL HIGH (ref 0.44–1.00)
GFR, EST NON AFRICAN AMERICAN: 55 mL/min — AB (ref >60)

## 2015-03-31 LAB — MRSA PCR SCREENING: MRSA by PCR: NEGATIVE

## 2015-03-31 LAB — CBC
HEMATOCRIT: 46.2 % — AB (ref 36.0–46.0)
HEMOGLOBIN: 15.8 g/dL — AB (ref 12.0–15.0)
MCH: 28.2 pg (ref 26.0–34.0)
MCHC: 34.2 g/dL (ref 30.0–36.0)
MCV: 82.4 fL (ref 78.0–100.0)
Platelets: 249 10*3/uL (ref 150–400)
RBC: 5.61 MIL/uL — ABNORMAL HIGH (ref 3.87–5.11)
RDW: 14.5 % (ref 11.5–15.5)
WBC: 11 10*3/uL — AB (ref 4.0–10.5)

## 2015-03-31 LAB — D-DIMER, QUANTITATIVE (NOT AT ARMC): D DIMER QUANT: 0.33 ug{FEU}/mL (ref 0.00–0.50)

## 2015-03-31 LAB — BRAIN NATRIURETIC PEPTIDE: B Natriuretic Peptide: 121.4 pg/mL — ABNORMAL HIGH (ref 0.0–100.0)

## 2015-03-31 SURGERY — LEFT HEART CATH AND CORONARY ANGIOGRAPHY
Anesthesia: LOCAL

## 2015-03-31 MED ORDER — HEPARIN (PORCINE) IN NACL 2-0.9 UNIT/ML-% IJ SOLN
INTRAMUSCULAR | Status: DC | PRN
  Administered 2015-03-31: 15:00:00

## 2015-03-31 MED ORDER — HYDROCODONE-ACETAMINOPHEN 5-325 MG PO TABS
1.0000 | ORAL_TABLET | ORAL | Status: DC | PRN
  Administered 2015-03-31: 1 via ORAL
  Filled 2015-03-31: qty 1

## 2015-03-31 MED ORDER — ENSURE ENLIVE PO LIQD
237.0000 mL | Freq: Two times a day (BID) | ORAL | Status: DC
  Administered 2015-04-01 (×2): 237 mL via ORAL

## 2015-03-31 MED ORDER — HEPARIN (PORCINE) IN NACL 2-0.9 UNIT/ML-% IJ SOLN
INTRAMUSCULAR | Status: AC
  Filled 2015-03-31: qty 1000

## 2015-03-31 MED ORDER — METOPROLOL TARTRATE 25 MG PO TABS
25.0000 mg | ORAL_TABLET | Freq: Two times a day (BID) | ORAL | Status: DC
  Administered 2015-03-31 – 2015-04-01 (×2): 25 mg via ORAL
  Filled 2015-03-31 (×2): qty 1

## 2015-03-31 MED ORDER — FENTANYL CITRATE (PF) 100 MCG/2ML IJ SOLN
INTRAMUSCULAR | Status: AC
  Filled 2015-03-31: qty 2

## 2015-03-31 MED ORDER — SODIUM CHLORIDE 0.9% FLUSH
3.0000 mL | Freq: Two times a day (BID) | INTRAVENOUS | Status: DC
  Administered 2015-03-31 – 2015-04-01 (×2): 3 mL via INTRAVENOUS

## 2015-03-31 MED ORDER — GABAPENTIN 300 MG PO CAPS
600.0000 mg | ORAL_CAPSULE | Freq: Three times a day (TID) | ORAL | Status: DC
  Administered 2015-03-31 – 2015-04-01 (×4): 600 mg via ORAL
  Filled 2015-03-31 (×4): qty 2

## 2015-03-31 MED ORDER — PNEUMOCOCCAL VAC POLYVALENT 25 MCG/0.5ML IJ INJ
0.5000 mL | INJECTION | INTRAMUSCULAR | Status: AC
  Administered 2015-04-01: 0.5 mL via INTRAMUSCULAR
  Filled 2015-03-31: qty 0.5

## 2015-03-31 MED ORDER — ALUM & MAG HYDROXIDE-SIMETH 200-200-20 MG/5ML PO SUSP
30.0000 mL | ORAL | Status: DC | PRN
  Administered 2015-03-31: 30 mL via ORAL
  Filled 2015-03-31: qty 30

## 2015-03-31 MED ORDER — ACETAMINOPHEN 325 MG PO TABS: 650.0000 mg | ORAL_TABLET | ORAL | Status: DC | PRN

## 2015-03-31 MED ORDER — SODIUM CHLORIDE 0.9 % IV SOLN
INTRAVENOUS | Status: AC
  Administered 2015-03-31: 15:00:00 via INTRAVENOUS

## 2015-03-31 MED ORDER — NITROGLYCERIN 1 MG/10 ML FOR IR/CATH LAB
INTRA_ARTERIAL | Status: AC
  Filled 2015-03-31: qty 10

## 2015-03-31 MED ORDER — TIZANIDINE HCL 4 MG PO TABS
6.0000 mg | ORAL_TABLET | Freq: Three times a day (TID) | ORAL | Status: DC
  Administered 2015-03-31 – 2015-04-01 (×4): 6 mg via ORAL
  Filled 2015-03-31 (×6): qty 1

## 2015-03-31 MED ORDER — VITAMIN D (ERGOCALCIFEROL) 1.25 MG (50000 UNIT) PO CAPS: 50000.0000 [IU] | ORAL_CAPSULE | ORAL | Status: DC

## 2015-03-31 MED ORDER — VERAPAMIL HCL 2.5 MG/ML IV SOLN
INTRAVENOUS | Status: DC | PRN
  Administered 2015-03-31: 14:00:00 via INTRA_ARTERIAL

## 2015-03-31 MED ORDER — VERAPAMIL HCL 2.5 MG/ML IV SOLN
INTRAVENOUS | Status: AC
  Filled 2015-03-31: qty 2

## 2015-03-31 MED ORDER — VITAMIN B-12 1000 MCG PO TABS
500.0000 ug | ORAL_TABLET | Freq: Every day | ORAL | Status: DC
  Administered 2015-03-31 – 2015-04-01 (×2): 500 ug via ORAL
  Filled 2015-03-31 (×2): qty 1

## 2015-03-31 MED ORDER — LIDOCAINE HCL (PF) 1 % IJ SOLN
INTRAMUSCULAR | Status: AC
  Filled 2015-03-31: qty 30

## 2015-03-31 MED ORDER — IOHEXOL 350 MG/ML SOLN
INTRAVENOUS | Status: DC | PRN
  Administered 2015-03-31: 125 mL via INTRAVENOUS

## 2015-03-31 MED ORDER — PANTOPRAZOLE SODIUM 40 MG PO TBEC
40.0000 mg | DELAYED_RELEASE_TABLET | Freq: Every day | ORAL | Status: DC
  Administered 2015-04-01: 40 mg via ORAL
  Filled 2015-03-31: qty 1

## 2015-03-31 MED ORDER — ASPIRIN EC 81 MG PO TBEC
81.0000 mg | DELAYED_RELEASE_TABLET | Freq: Every day | ORAL | Status: DC
  Administered 2015-04-01: 81 mg via ORAL
  Filled 2015-03-31: qty 1

## 2015-03-31 MED ORDER — HEPARIN SODIUM (PORCINE) 1000 UNIT/ML IJ SOLN
INTRAMUSCULAR | Status: AC
  Filled 2015-03-31: qty 1

## 2015-03-31 MED ORDER — NITROGLYCERIN 0.4 MG SL SUBL
0.4000 mg | SUBLINGUAL_TABLET | SUBLINGUAL | Status: DC | PRN
  Administered 2015-03-31 (×3): 0.4 mg via SUBLINGUAL
  Filled 2015-03-31 (×3): qty 1

## 2015-03-31 MED ORDER — INFLUENZA VAC SPLIT QUAD 0.5 ML IM SUSY
0.5000 mL | PREFILLED_SYRINGE | INTRAMUSCULAR | Status: AC
  Administered 2015-04-01: 0.5 mL via INTRAMUSCULAR
  Filled 2015-03-31: qty 0.5

## 2015-03-31 MED ORDER — LIDOCAINE HCL (PF) 1 % IJ SOLN
INTRAMUSCULAR | Status: DC | PRN
  Administered 2015-03-31: 2 mL

## 2015-03-31 MED ORDER — CITALOPRAM HYDROBROMIDE 10 MG PO TABS
10.0000 mg | ORAL_TABLET | Freq: Every day | ORAL | Status: DC
  Administered 2015-04-01: 10 mg via ORAL
  Filled 2015-03-31: qty 1

## 2015-03-31 MED ORDER — ZOLPIDEM TARTRATE 5 MG PO TABS: 5.0000 mg | ORAL_TABLET | Freq: Every evening | ORAL | Status: DC | PRN

## 2015-03-31 MED ORDER — PROMETHAZINE HCL 25 MG/ML IJ SOLN
12.5000 mg | Freq: Once | INTRAMUSCULAR | Status: AC
  Administered 2015-03-31: 12.5 mg via INTRAVENOUS
  Filled 2015-03-31: qty 1

## 2015-03-31 MED ORDER — ROPINIROLE HCL 1 MG PO TABS
3.0000 mg | ORAL_TABLET | Freq: Every day | ORAL | Status: DC
  Administered 2015-03-31: 3 mg via ORAL
  Filled 2015-03-31: qty 3

## 2015-03-31 MED ORDER — FERROUS FUM-IRON POLYSACCH 162-115.2 MG PO CAPS: 1.0000 | ORAL_CAPSULE | ORAL | Status: DC

## 2015-03-31 MED ORDER — HEPARIN SODIUM (PORCINE) 5000 UNIT/ML IJ SOLN
5000.0000 [IU] | Freq: Three times a day (TID) | INTRAMUSCULAR | Status: DC
  Administered 2015-03-31 – 2015-04-01 (×4): 5000 [IU] via SUBCUTANEOUS
  Filled 2015-03-31 (×4): qty 1

## 2015-03-31 MED ORDER — SODIUM CHLORIDE 0.9% FLUSH: 3.0000 mL | INTRAVENOUS | Status: DC | PRN

## 2015-03-31 MED ORDER — MILNACIPRAN HCL 50 MG PO TABS
100.0000 mg | ORAL_TABLET | Freq: Two times a day (BID) | ORAL | Status: DC
  Administered 2015-03-31 – 2015-04-01 (×2): 100 mg via ORAL
  Filled 2015-03-31 (×3): qty 2

## 2015-03-31 MED ORDER — MIDAZOLAM HCL 2 MG/2ML IJ SOLN
INTRAMUSCULAR | Status: AC
  Filled 2015-03-31: qty 2

## 2015-03-31 MED ORDER — ONDANSETRON HCL 4 MG/2ML IJ SOLN
4.0000 mg | Freq: Four times a day (QID) | INTRAMUSCULAR | Status: DC | PRN
  Administered 2015-03-31 (×2): 4 mg via INTRAVENOUS
  Filled 2015-03-31 (×2): qty 2

## 2015-03-31 MED ORDER — SIMVASTATIN 20 MG PO TABS
20.0000 mg | ORAL_TABLET | Freq: Every day | ORAL | Status: DC
  Administered 2015-03-31: 20 mg via ORAL
  Filled 2015-03-31: qty 1

## 2015-03-31 MED ORDER — SODIUM CHLORIDE 0.9 % IV SOLN
INTRAVENOUS | Status: DC
  Administered 2015-03-31: 125 mL via INTRAVENOUS
  Administered 2015-04-01: 10:00:00 via INTRAVENOUS

## 2015-03-31 MED ORDER — FENTANYL CITRATE (PF) 100 MCG/2ML IJ SOLN
INTRAMUSCULAR | Status: DC | PRN
  Administered 2015-03-31 (×2): 25 ug via INTRAVENOUS

## 2015-03-31 MED ORDER — HEPARIN SODIUM (PORCINE) 1000 UNIT/ML IJ SOLN
INTRAMUSCULAR | Status: DC | PRN
  Administered 2015-03-31: 4000 [IU] via INTRAVENOUS

## 2015-03-31 MED ORDER — MIDAZOLAM HCL 2 MG/2ML IJ SOLN
INTRAMUSCULAR | Status: DC | PRN
  Administered 2015-03-31 (×2): 1 mg via INTRAVENOUS

## 2015-03-31 MED ORDER — SODIUM CHLORIDE 0.9 % IV SOLN: 250.0000 mL | INTRAVENOUS | Status: DC | PRN

## 2015-03-31 SURGICAL SUPPLY — 16 items
CATH INFINITI 5 FR JL3.5 (CATHETERS) ×2 IMPLANT
CATH INFINITI 5FR ANG PIGTAIL (CATHETERS) ×2 IMPLANT
CATH INFINITI JR4 5F (CATHETERS) ×2 IMPLANT
COVER PRB 48X5XTLSCP FOLD TPE (BAG) ×1 IMPLANT
COVER PROBE 5X48 (BAG) ×1
DEVICE RAD COMP TR BAND LRG (VASCULAR PRODUCTS) ×2 IMPLANT
ELECT DEFIB PAD ADLT CADENCE (PAD) ×2 IMPLANT
GLIDESHEATH SLEND SS 6F .021 (SHEATH) ×2 IMPLANT
KIT ENCORE 26 ADVANTAGE (KITS) ×2 IMPLANT
KIT HEART LEFT (KITS) ×2 IMPLANT
PACK CARDIAC CATHETERIZATION (CUSTOM PROCEDURE TRAY) ×2 IMPLANT
SYR MEDRAD MARK V 150ML (SYRINGE) ×2 IMPLANT
TRANSDUCER W/STOPCOCK (MISCELLANEOUS) ×2 IMPLANT
TUBING CIL FLEX 10 FLL-RA (TUBING) ×2 IMPLANT
WIRE HI TORQ VERSACORE-J 145CM (WIRE) ×2 IMPLANT
WIRE SAFE-T 1.5MM-J .035X260CM (WIRE) ×2 IMPLANT

## 2015-03-31 NOTE — H&P (Signed)
History and Physical  Patient ID: Kristy Sloan MRN: [id number], SOB: April 11, 1960 55 y.o. Date of Encounter: 03/31/2015, 3:06 PM  Primary Physician: Georgiann Hahn, MD Primary Cardiologist: Dr Riley Kill  Chief Complaint: Chest pain  HPI: 55 y.o. female w/ PMHx significant for CAD and anterior MI who presented to [facility] on 03/31/2015 with complaints of chest pain. Her last PCI procedure in 2013 was performed when she presented with unstable angina.   The patient complains of 2-3 days of intermittent substernal chest pressure, nausea, and vomiting. She's also had diarrhea. Her chest pain intensified last night and she ultimately went to the [facility] emergency department today. EKG demonstrated low voltage with subtle lateral ST segment elevation, very concerning for STEMI. After discussion with Dr. Jeannine Kitten, we activated a code STEMI and she is brought directly to the [facility] cardiac catheterization lab.  On arrival here she continues to complain of 8/10 chest pain. There is associated shortness of breath, nausea is improved after receiving Zofran. Pain feels similar to her previous cardiac symptoms. She denies radiation of pain into the neck, jaw, shoulders, or back. The patient has been uninsured until recently. She is not taking any of her medications. She has not seen a physician in some time now. Her last cardiology visit was with Dr. Riley Kill before his retirement.   Past Medical History  Diagnosis Date  . Anxiety   . Spinal stenosis   . Fibromyalgia   . Restless legs syndrome   . Tobacco abuse     a. reports quitting 12/2010  . Pericarditis     a. In setting of after anterior MI 03/2011  . Sleep apnea   . Coronary artery disease     a.  03/05/2011 - Ant MI - LAD stented w/ 2.75 x 16 mm Promus  DES - course complicated by Pericarditis, re-look cath ok. 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  Proximal area stented  with 2.75x24mm Promus DES  . GERD (gastroesophageal reflux disease)   . 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 Pleural effusion, left     a. Felt related to Dressler's s/p thoracentesis 03/27/11 (reactive cells only)  . Hypertension   . Left ventricular apical thrombus     a. Noted on echo 06/07/2011.  NL EF;  b Coumadin initiated 06/2011  . High cholesterol   . Peripheral vascular disease   . Heart murmur   . CHF (congestive heart failure)   . Angina   . Myocardial infarction (HCC) 03/05/11  . Shortness of breath 06/16/11    "all the time"  . Umbilical hernia     unrepaired (06/16/11)  . Chronic headache 06/16/11    "every other day or so"  . Depression      Surgical History:  Past Surgical History  Procedure Laterality Date  . Cesarean section  01/1992  . Carpal tunnel release  01/2010; 03/2010    left; right  . Coronary angioplasty with stent placement  03/05/11; 06/06/11    "1 + 1; total of 2"  . Coronary angioplasty  03/05/11; 06/06/11  . Tubal ligation  03/1992  . Left heart catheterization with coronary angiogram N/A 03/05/2011    Procedure: LEFT HEART CATHETERIZATION WITH CORONARY ANGIOGRAM;  Surgeon: Herby Abraham, MD;  Location: [facility] CATH LAB;  Service: Cardiovascular;  Laterality: N/A;  . Percutaneous coronary stent intervention (pci-s)  03/05/2011    Procedure:  PERCUTANEOUS CORONARY STENT INTERVENTION (PCI-S);  Surgeon: Herby Abraham, MD;  Location: [facility] CATH LAB;  Service: Cardiovascular;;  . Left heart catheterization with coronary angiogram N/A 03/06/2011    Procedure: LEFT HEART CATHETERIZATION WITH CORONARY ANGIOGRAM;  Surgeon: Herby Abraham, MD;  Location: [facility] CATH LAB;  Service: Cardiovascular;  Laterality: N/A;  . Left heart catheterization with coronary angiogram N/A 06/06/2011    Procedure: LEFT HEART CATHETERIZATION WITH CORONARY ANGIOGRAM;  Surgeon: Herby Abraham, MD;  Location: [facility] CATH LAB;  Service: Cardiovascular;  Laterality: N/A;     Home Meds: Prior  to Admission medications   Medication Sig Start Date End Date Taking? Authorizing Provider  aspirin 81 MG EC tablet Take 1 tablet (81 mg total) by mouth daily. 07/18/11   Herby Abraham, MD  citalopram (CELEXA) 10 MG tablet Take 10 mg by mouth daily.    Historical Provider, MD  clopidogrel (PLAVIX) 75 MG tablet Take 75 mg by mouth daily.    Historical Provider, MD  cyanocobalamin (,VITAMIN B-12,) 1000 MCG/ML injection Inject 1,000 mcg into the muscle every 7 (seven) days. Takes on Saturday  03/08/11   Historical Provider, MD  cyanocobalamin 500 MCG tablet Take 500 mcg by mouth daily.      Historical Provider, MD  Docusate Sodium (DOC-Q-LACE PO) Take 100 mg by mouth as needed.    Historical Provider, MD  ferrous fumarate-iron polysaccharide complex (TANDEM) 162-115.2 MG CAPS Take 1 capsule by mouth once a week. Take on wednesday 04/28/11   Herby Abraham, MD  gabapentin (NEURONTIN) 300 MG capsule Take 600 mg by mouth 3 (three) times daily.      Historical Provider, MD  HYDROcodone-acetaminophen (LORTAB) 10-500 MG per tablet Take 1 tablet by mouth every 4 (four) hours as needed. For pain    Historical Provider, MD  metoprolol tartrate (LOPRESSOR) 25 MG tablet Take 25 mg by mouth 2 (two) times daily. 03/30/11 03/29/12  Dayna N Dunn, PA-C  Milnacipran (SAVELLA) 50 MG TABS Take 100 mg by mouth 2 (two) times daily.      Historical Provider, MD  neomycin-bacitracin-polymyxin (NEOSPORIN) OINT Apply 1 application topically as needed. 06/13/11   Ok Anis, NP  nitroGLYCERIN (NITROSTAT) 0.4 MG SL tablet Place 0.4 mg under the tongue every 5 (five) minutes as needed. For chest pain    Historical Provider, MD  pantoprazole (PROTONIX) 40 MG tablet Take 40 mg by mouth daily at 6 (six) AM. 03/15/11 03/14/12  Jodelle Gross, NP  rOPINIRole (REQUIP) 3 MG tablet Take 3 mg by mouth at bedtime.    Historical Provider, MD  simvastatin (ZOCOR) 20 MG tablet Take 20 mg by mouth daily at 6 PM. 03/15/11 03/14/12   Jodelle Gross, NP  tiZANidine (ZANAFLEX) 4 MG tablet Take 6 mg by mouth 3 (three) times daily. 1 and 1/2 tablet Three times daily    Historical Provider, MD  Vitamin D, Ergocalciferol, (DRISDOL) 50000 UNITS CAPS Take 50,000 Units by mouth every 7 (seven) days. Take on wednesday    Historical Provider, MD  zolpidem (AMBIEN) 10 MG tablet Take 10 mg by mouth at bedtime as needed. For sleep    Historical Provider, MD    Allergies: No Known Allergies  Social History   Social History  . Marital Status: Married    Spouse Name: N/A  . Number of Children: N/A  . Years of Education: N/A   Occupational History  . unemployed    Social History Main Topics  . Smoking  status: Former Smoker -- 1.00 packs/day for 20 years    Types: Cigarettes    Quit date: 12/04/2010  . Smokeless tobacco: Never Used  . Alcohol Use: No  . Drug Use: No  . Sexual Activity: Not Currently   Other Topics Concern  . Not on file   Social History Narrative   [place]. Married. Doesn't currently work and has applied for financial assistance programs. Lives in private residence with husband and daughter. Has 4 sisters and one brother.  All siblings are alive, only one is well. Oldest sister has a heart murmur. Second to oldest sister has anxiety/depression and HTN. Middle sister has ?CA, HTN and depression. Younger sister has stomach cancer, DM, anxiety/depression and HTN.     Family History  Problem Relation Age of Onset  . Coronary artery disease Father     bilat BKA  . Coronary artery disease Sister     Posey Rea of age, but sister is younger  . Diabetes Father   . Cancer Father   . Stroke Father   . Heart attack Father   . Diabetes Mother   . Hypertension Mother     Review of Systems: General: negative for chills, fever, night sweats or weight changes.  ENT: negative for rhinorrhea or epistaxis Cardiovascular: see HPI Dermatological: negative for rash Respiratory: negative for  cough or wheezing, positive for shortness of breath GI: negative for bright red blood per rectum, melena, or hematemesis GU: no hematuria, urgency, or frequency Neurologic: negative for visual changes, syncope, headache, or dizziness Heme: no easy bruising or bleeding Endo: negative for excessive thirst, thyroid disorder, or flushing Musculoskeletal: negative for joint pain or swelling, negative for myalgias All other systems reviewed and are otherwise negative except as noted above.  Physical Exam: Blood pressure 129/80, pulse 0, resp. rate 16, SpO2 0 %. General: Well developed, well nourished, alert and oriented, obese, pleasant woman in no acute distress. HEENT: Normocephalic, atraumatic, sclera anicteric Neck: Supple. Carotids 2+ without bruits. JVP normal Lungs:  without wheezes, rales. There are scattered rhonchi. Breathing is unlabored. Heart: RRR with normal S1 and S2. No murmurs, rubs, or gallops appreciated. Abdomen: Soft, non-tender, non-distended with normoactive bowel sounds. No hepatomegaly. No rebound/guarding. No obvious abdominal masses. Back: No CVA tenderness Msk:  Strength and tone appear normal for age. Extremities: No clubbing, cyanosis, or edema.  Distal pedal pulses are 2+ and equal bilaterally. Neuro: CNII-XII intact, moves all extremities spontaneously. Psych:  Responds to questions appropriately with a normal affect. Skin: warm and dry without rash   Labs:   Lab Results  Component Value Date   WBC 10.9* 06/26/2011   HGB 13.0 06/26/2011   HCT 39.8 06/26/2011   MCV 82.0 06/26/2011   PLT 384.0 06/26/2011   No results for input(s): NA, K, CL, CO2, BUN, CREATININE, CALCIUM, PROT, BILITOT, ALKPHOS, ALT, AST, GLUCOSE in the last 168 hours.  Invalid input(s): LABALBU No results for input(s): CKTOTAL, CKMB, TROPONINI in the last 72 hours. Lab Results  Component Value Date   CHOL 237* 03/05/2011   HDL 34* 03/05/2011   LDLCALC 177* 03/05/2011   TRIG 128  03/05/2011   No results found for: DDIMER  Radiology/Studies:  No results found.   EKG: NSR, age-indeterminate inferior MI, age-indeterminate anterior MI, possible lateral STEMI with subtle ST elevation  CARDIAC STUDIES: Cath pending  ASSESSMENT AND PLAN:  1. Chest pain suspect acute lateral STEMI 2. Known CAD, s/p DES placement in LAD 2013 3.  Hyperlipidemia 4. Hypertension 5. Hx tobacco - quit smoking one year ago  Plan: pt has received Brilinta 180 mg and heparin prior to arrival. Plan emergency cath and possible PCI with disposition pending cath results. Will need case manager consult and to get patient back on her maintenance medications. Emergency implied consent obtained for cath and PCI.   Enzo Bi MD 03/31/2015, 3:06 PM

## 2015-03-31 NOTE — Progress Notes (Signed)
Notified md pt n/v.  Not time for sch. Med.  New orders received. Will continue to monitor. Karena Addison T

## 2015-03-31 NOTE — Progress Notes (Signed)
Md notified of pt nausea vomiting unable to give medication.  Pt stated been this way for 2 days.  New orders received.  Will continue to monitor. Karena Addison T

## 2015-03-31 NOTE — Progress Notes (Signed)
md notified of pt still having 8/10 cp. 3 sl ntg tabs given, obtained ekg.  No change.  Pt having headache.  Gave  Pain med. Will continue to monitor. Karena Addison T

## 2015-04-01 ENCOUNTER — Observation Stay (HOSPITAL_COMMUNITY): Payer: Medicaid - Out of State

## 2015-04-01 DIAGNOSIS — Z87891 Personal history of nicotine dependence: Secondary | ICD-10-CM | POA: Diagnosis not present

## 2015-04-01 DIAGNOSIS — I2510 Atherosclerotic heart disease of native coronary artery without angina pectoris: Secondary | ICD-10-CM

## 2015-04-01 DIAGNOSIS — Z720 Tobacco use: Secondary | ICD-10-CM

## 2015-04-01 DIAGNOSIS — I25110 Atherosclerotic heart disease of native coronary artery with unstable angina pectoris: Secondary | ICD-10-CM | POA: Diagnosis not present

## 2015-04-01 DIAGNOSIS — Z8249 Family history of ischemic heart disease and other diseases of the circulatory system: Secondary | ICD-10-CM | POA: Diagnosis not present

## 2015-04-01 DIAGNOSIS — I252 Old myocardial infarction: Secondary | ICD-10-CM | POA: Diagnosis not present

## 2015-04-01 LAB — BASIC METABOLIC PANEL
Anion gap: 12 (ref 5–15)
Anion gap: 11 (ref 5–15)
BUN: 12 mg/dL (ref 6–20)
BUN: 13 mg/dL (ref 6–20)
CALCIUM: 8.9 mg/dL (ref 8.9–10.3)
CHLORIDE: 105 mmol/L (ref 101–111)
CHLORIDE: 104 mmol/L (ref 101–111)
CO2: 24 mmol/L (ref 22–32)
CO2: 24 mmol/L (ref 22–32)
CREATININE: 1.17 mg/dL — AB (ref 0.44–1.00)
CREATININE: 1.06 mg/dL — AB (ref 0.44–1.00)
Calcium: 8.6 mg/dL — ABNORMAL LOW (ref 8.9–10.3)
GFR calc non Af Amer: 53 mL/min — ABNORMAL LOW (ref >60)
GFR calc non Af Amer: 59 mL/min — ABNORMAL LOW (ref >60)
GLUCOSE: 161 mg/dL — AB (ref 65–99)
Glucose, Bld: 132 mg/dL — ABNORMAL HIGH (ref 65–99)
Potassium: 3.4 mmol/L — ABNORMAL LOW (ref 3.5–5.1)
Potassium: 3.6 mmol/L (ref 3.5–5.1)
SODIUM: 141 mmol/L (ref 135–145)
Sodium: 139 mmol/L (ref 135–145)

## 2015-04-01 LAB — LIPID PANEL
CHOL/HDL RATIO: 9.3 ratio
Cholesterol: 251 mg/dL — ABNORMAL HIGH (ref 0–200)
HDL: 27 mg/dL — ABNORMAL LOW (ref >40)
LDL CALC: 152 mg/dL — AB (ref 0–99)
Triglycerides: 359 mg/dL — ABNORMAL HIGH (ref <150)
VLDL: 72 mg/dL — ABNORMAL HIGH (ref 0–40)

## 2015-04-01 LAB — CBC
HEMATOCRIT: 46.9 % — AB (ref 36.0–46.0)
Hemoglobin: 15.3 g/dL — ABNORMAL HIGH (ref 12.0–15.0)
MCH: 27.5 pg (ref 26.0–34.0)
MCHC: 32.6 g/dL (ref 30.0–36.0)
MCV: 84.4 fL (ref 78.0–100.0)
PLATELETS: 278 10*3/uL (ref 150–400)
RBC: 5.56 MIL/uL — AB (ref 3.87–5.11)
RDW: 14.7 % (ref 11.5–15.5)
WBC: 13.5 10*3/uL — ABNORMAL HIGH (ref 4.0–10.5)

## 2015-04-01 LAB — POCT I-STAT, CHEM 8
BUN: 13 mg/dL (ref 6–20)
Calcium, Ion: 1.19 mmol/L (ref 1.12–1.23)
Chloride: 100 mmol/L — ABNORMAL LOW (ref 101–111)
Creatinine, Ser: 1 mg/dL (ref 0.44–1.00)
Glucose, Bld: 105 mg/dL — ABNORMAL HIGH (ref 65–99)
HEMATOCRIT: 49 % — AB (ref 36.0–46.0)
Hemoglobin: 16.7 g/dL — ABNORMAL HIGH (ref 12.0–15.0)
Potassium: 3.3 mmol/L — ABNORMAL LOW (ref 3.5–5.1)
SODIUM: 138 mmol/L (ref 135–145)
TCO2: 25 mmol/L (ref 0–100)

## 2015-04-01 LAB — INFLUENZA PANEL BY PCR (TYPE A & B)
H1N1FLUPCR: NOT DETECTED
INFLAPCR: NEGATIVE
Influenza B By PCR: NEGATIVE

## 2015-04-01 LAB — MAGNESIUM: Magnesium: 2 mg/dL (ref 1.7–2.4)

## 2015-04-01 LAB — LIPASE, BLOOD: LIPASE: 24 U/L (ref 11–51)

## 2015-04-01 LAB — TROPONIN I: Troponin I: <0.03 ng/mL (ref <0.031)

## 2015-04-01 MED ORDER — PROMETHAZINE HCL 25 MG/ML IJ SOLN
12.5000 mg | Freq: Four times a day (QID) | INTRAMUSCULAR | Status: DC | PRN
  Administered 2015-04-01: 12.5 mg via INTRAVENOUS
  Filled 2015-04-01: qty 1

## 2015-04-01 MED ORDER — GI COCKTAIL ~~LOC~~
30.0000 mL | Freq: Once | ORAL | Status: AC
  Administered 2015-04-01: 30 mL via ORAL
  Filled 2015-04-01: qty 30

## 2015-04-01 NOTE — Progress Notes (Signed)
Md notified pt awaken from sleep.  Nausea vomiting. Mid chest pain.  Will try GI cocktail and give another phenergan iv.  Vs stable will continue to monitor. Karena Addison T

## 2015-04-01 NOTE — Discharge Summary (Signed)
Discharge Summary    Patient ID: Kristy Sloan,  MRN: [id number], DOB/AGE: Apr 02, 1960 55 y.o.  Admit date: 03/31/2015 Discharge date: 04/01/2015  Primary Care Provider: [facility] Primary Cardiologist:  Dr. Excell Seltzer (Previously Dr. Riley Kill)  Discharge Diagnoses    Active Problems:   Tobacco abuse   Coronary artery disease   Chest pain at rest   Allergies No Known Allergies  Diagnostic Studies/Procedures    Emergent [facility] 03/31/15 Procedures    Left Heart Cath and Coronary Angiography    Conclusion     Prox RCA lesion, 25% stenosed.  Ost RPDA lesion, 50% stenosed.  Dist RCA lesion, 50% stenosed.  Ost Ramus lesion, 40% stenosed.  Mid Cx lesion, 25% stenosed.  Ost 1st Diag lesion, 60% stenosed.  Dist LAD-2 lesion, 100% stenosed.  Dist LAD-1 lesion, 40% stenosed.  1. Mild nonobstructive disease of the left main, left circumflex, and RCA 2. Continued patency of the stented segment in the proximal LAD with what appears to be chronic occlusion of the apical LAD with left to left collaterals 3. Moderate stenosis at the RCA bifurcation involving the PDA and PLA branches, small vessels in these areas. 4. Focal LV wall motion abnormality involving the apex with preserved overall LVEF    CXR 04/01/15 FINDINGS: Low lung volumes with bibasilar atelectasis. Heart is upper limits normal in size. No effusions or acute bony abnormality.  IMPRESSION: Low lung volumes, bibasilar atelectasis.  History of Present Illness    55 y.o. female w/ PMHx significant for CAD and anterior MI who presented to [facility] on 03/31/2015 with complaints of chest pain. Her last PCI procedure in 2013 was performed when she presented with unstable angina.   The patient complains of 2-3 days of intermittent substernal chest pressure, nausea, and vomiting. She's also had diarrhea. Her chest pain intensified last night and she ultimately went to the [facility] emergency department  today. EKG demonstrated low voltage with subtle lateral ST segment elevation, very concerning for STEMI. After discussion with Dr. Jeannine Kitten, we activated a code STEMI and she is brought directly to the [facility] cardiac catheterization lab.  On arrival here she continues to complain of 8/10 chest pain. There is associated shortness of breath, nausea is improved after receiving Zofran. Pain feels similar to her previous cardiac symptoms. She denies radiation of pain into the neck, jaw, shoulders, or back. The patient has been uninsured until recently. She is not taking any of her medications. She has not seen a physician in some time now. Her last cardiology visit was with Dr. Riley Kill before his retirement.   Hospital Course     Patient was taken urgently to the [facility] cath lab upon arrival to [facility]. The procedure was performed by Dr. Excell Seltzer. She was found to have no evidence of significant obstruction. The LAD stent was patent. The apical LAD is chronically occluded. There was no significant change from prior angiogram. She had normal LVEDP. She left the cath lab in stable condition. Serial cardiac markers remained negative. A d-dimer was checked to r/o possibility of PE and was negative. EKGs had no evidence of evolution. CXR showed bibasilar atelectasis but was otherwise w/o and additional abnormalities. Her CP was of uncertain etiology, but MI and PE were both ruled-up. Dr. Katrinka Blazing felt that she was stable for discharge, as she had no other issues and no post cath complications. She was continued on all of her previous home meds.   Consultants: None   Discharge  Vitals Blood pressure 103/70, pulse 81, temperature 98.6 F (37 C), temperature source Oral, resp. rate 20, height  (1.6 m), weight 217 lb 13 oz (98.8 kg), last menstrual period 04/15/2011, SpO2 96 %.  Filed Weights   03/31/15 1516  Weight: 217 lb 13 oz (98.8 kg)    Labs & Radiologic Studies     CBC  Recent Labs  03/31/15 1630  04/01/15 0315  WBC 11.0* 13.5*  HGB 15.8* 15.3*  HCT 46.2* 46.9*  MCV 82.4 84.4  PLT 249 278   Basic Metabolic Panel  Recent Labs  03/31/15 2300 04/01/15 0315  NA 141 139  K 3.4* 3.6  CL 105 104  CO2 24 24  GLUCOSE 161* 132*  BUN 12 13  CREATININE 1.17* 1.06*  CALCIUM 8.9 8.6*  MG 2.0  --    Liver Function Tests No results for input(s): AST, ALT, ALKPHOS, BILITOT, PROT, ALBUMIN in the last 72 hours.  Recent Labs  03/31/15 2300  LIPASE 24   Cardiac Enzymes  Recent Labs  03/31/15 1630 03/31/15 2051 04/01/15 0315  TROPONINI <0.03 <0.03 <0.03   BNP Invalid input(s): POCBNP D-Dimer  Recent Labs  03/31/15 1630  DDIMER 0.33   Hemoglobin A1C No results for input(s): HGBA1C in the last 72 hours. Fasting Lipid Panel  Recent Labs  04/01/15 0315  CHOL 251*  HDL 27*  LDLCALC 152*  TRIG 359*  CHOLHDL 9.3   Thyroid Function Tests No results for input(s): TSH, T4TOTAL, T3FREE, THYROIDAB in the last 72 hours.  Invalid input(s): FREET3  Dg Chest 2 View  04/01/2015  CLINICAL DATA:  Midline chest pain for 4 days. EXAM: CHEST  2 VIEW COMPARISON:  12/19/2014 FINDINGS: Low lung volumes with bibasilar atelectasis. Heart is upper limits normal in size. No effusions or acute bony abnormality. IMPRESSION: Low lung volumes, bibasilar atelectasis. Electronically Signed   By: Charlett Nose M.D.   On: 04/01/2015 12:02    Disposition   Pt is being discharged home today in good condition.  Follow-up Plans & Appointments    Follow-up Information    Follow up with Tonny Bollman, MD.   Specialty:  Cardiology   Why:  our office will call you for a follow-up with Dr. Randolm Idol PA   Contact information:   [address] [place] [address] [phone]        Discharge Medications   Current Discharge Medication List    CONTINUE these medications which have NOT CHANGED   Details  aspirin 81 MG EC tablet Take 1 tablet (81 mg total) by mouth  daily.    citalopram (CELEXA) 10 MG tablet Take 10 mg by mouth daily.    cyanocobalamin (,VITAMIN B-12,) 1000 MCG/ML injection Inject 1,000 mcg into the muscle every 7 (seven) days. Takes on Saturday     cyanocobalamin 500 MCG tablet Take 500 mcg by mouth daily.      Docusate Sodium (DOC-Q-LACE PO) Take 100 mg by mouth as needed.    ferrous fumarate-iron polysaccharide complex (TANDEM) 162-115.2 MG CAPS Take 1 capsule by mouth once a week. Take on wednesday   Associated Diagnoses: Coronary atherosclerosis of unspecified type of vessel, native or graft    gabapentin (NEURONTIN) 300 MG capsule Take 600 mg by mouth 4 (four) times daily.     HYDROcodone-acetaminophen (LORTAB) 10-500 MG per tablet Take 1 tablet by mouth every 4 (four) hours as needed. For pain    Milnacipran (SAVELLA) 50 MG TABS Take 100 mg  by mouth 2 (two) times daily.      neomycin-bacitracin-polymyxin (NEOSPORIN) OINT Apply 1 application topically as needed.    nitroGLYCERIN (NITROSTAT) 0.4 MG SL tablet Place 0.4 mg under the tongue every 5 (five) minutes as needed. For chest pain    rOPINIRole (REQUIP) 3 MG tablet Take 3 mg by mouth at bedtime.    tiZANidine (ZANAFLEX) 4 MG tablet Take 6 mg by mouth 3 (three) times daily. 1 and 1/2 tablet Three times daily    zolpidem (AMBIEN) 10 MG tablet Take 10 mg by mouth at bedtime as needed. For sleep    clopidogrel (PLAVIX) 75 MG tablet Take 75 mg by mouth daily. Reported on 03/31/2015    metoprolol tartrate (LOPRESSOR) 25 MG tablet Take 25 mg by mouth 2 (two) times daily.    pantoprazole (PROTONIX) 40 MG tablet Take 40 mg by mouth daily at 6 (six) AM.    simvastatin (ZOCOR) 20 MG tablet Take 20 mg by mouth daily at 6 PM.    Vitamin D, Ergocalciferol, (DRISDOL) 50000 UNITS CAPS Take 50,000 Units by mouth every 7 (seven) days. Take on wednesday           Outstanding Labs/Studies    Duration of Discharge Encounter   Greater than 30 minutes including physician  time.  Signed, Brytni Dray NP 04/01/2015, 1:10 PM

## 2015-04-01 NOTE — Progress Notes (Signed)
       Patient Name: Kristy Sloan Date of Encounter: 04/01/2015    SUBJECTIVE: Chest pain has resolved. She underwent urgent catheterization yesterday with no evidence of significant obstruction. LAD stent was patent. Apical LAD chronically occluded. No significant change from prior angiogram. She had normal left ventricular end-diastolic pressure. Subsequent d-dimer is normal. Serial cardiac markers are negative. EKGs have not revealed any evidence of evolution.  TELEMETRY:  Normal sinus rhythm. Filed Vitals:   04/01/15 0400 04/01/15 0500 04/01/15 0600 04/01/15 0753  BP: 138/78 123/76 126/78 130/73  Pulse: 104 101 99 90  Temp:    98.6 F (37 C)  TempSrc:    Oral  Resp: Height:      Weight:      SpO2: 96% 97% 95% 96%    Intake/Output Summary (Last 24 hours) at 04/01/15 0911 Last data filed at 04/01/15 0700  Gross per 24 hour  Intake 1733.75 ml  Output    325 ml  Net 1408.75 ml   LABS: Basic Metabolic Panel:  Recent Labs  08/65/78 2300 04/01/15 0315  NA 141 139  K 3.4* 3.6  CL 105 104  CO2 24 24  GLUCOSE 161* 132*  BUN 12 13  CREATININE 1.17* 1.06*  CALCIUM 8.9 8.6*  MG 2.0  --    CBC:  Recent Labs  03/31/15 1630 04/01/15 0315  WBC 11.0* 13.5*  HGB 15.8* 15.3*  HCT 46.2* 46.9*  MCV 82.4 84.4  PLT 249 278   Cardiac Enzymes:  Recent Labs  03/31/15 1630 03/31/15 2051 04/01/15 0315  TROPONINI <0.03 <0.03 <0.03   BNP: Invalid input(s): POCBNP Hemoglobin A1C: No results for input(s): HGBA1C in the last 72 hours. Fasting Lipid Panel:  Recent Labs  04/01/15 0315  CHOL 251*  HDL 27*  LDLCALC 152*  TRIG 359*  CHOLHDL 9.3    Radiology/Studies:  Chest x-ray was not performed.  Physical Exam: Blood pressure 130/73, pulse 90, temperature 98.6 F (37 C), temperature source Oral, resp. rate 23, height  (1.6 m), weight 217 lb 13 oz (98.8 kg), SpO2 96 %. Weight change:   Wt Readings from Last 3 Encounters:  03/31/15 217 lb  13 oz (98.8 kg)  07/18/11 224 lb (101.606 kg)  06/26/11 226 lb (102.513 kg)   Basal rales are heard bilaterally. Suspect related to atelectasis. Cardiac exam reveals no murmur or gallop Extremities reveal no edema. Overall, morbidly obese. Radial cath site is unremarkable   ASSESSMENT:  1. Chest pain of uncertain etiology. Myocardial infarction ruled out. Doubt PE with normal d-dimer and no complain of dyspnea. 2. CAD with widely patent LAD stent. Chronically occluded apical LAD. 3. Morbid obesity with bilateral basilar rales suspected related to atelectasis.  Plan:  1. PA lateral chest x-ray 2. Ambulate 3. Discharge later today unless surprises found on x-ray   Signed, Cerina Leary III,Travus Oren W 04/01/2015, 9:11 AM

## 2015-04-01 NOTE — Progress Notes (Signed)
Pt is nausea and pain free.  Vs stable, will continue to monitor. Karena Addison T

## 2015-04-01 NOTE — Progress Notes (Signed)
Notified md.  Pt still have nausea.  maalox given earlier did help some.  md aware of labs.  Will continue to monitor. Karena Addison T

## 2015-04-02 ENCOUNTER — Encounter (HOSPITAL_COMMUNITY): Payer: Self-pay | Admitting: Cardiovascular Disease

## 2015-04-02 LAB — HEMOGLOBIN A1C
HEMOGLOBIN A1C: 5.6 % (ref 4.8–5.6)
MEAN PLASMA GLUCOSE: 114 mg/dL

## 2015-04-02 MED FILL — Nitroglycerin IV Soln 100 MCG/ML in D5W: INTRA_ARTERIAL | Qty: 10 | Status: AC

## 2021-12-01 DEATH — deceased
